# Patient Record
Sex: Female | Born: 1965 | Race: White | Hispanic: No | Marital: Single | Smoking: Never smoker
Health system: Southern US, Community
[De-identification: ages and names within clinical notes are randomized; demographics above are authoritative.]

## PROBLEM LIST (undated history)

## (undated) DIAGNOSIS — C8590 Non-Hodgkin lymphoma, unspecified, unspecified site: Secondary | ICD-10-CM

## (undated) DIAGNOSIS — Z992 Dependence on renal dialysis: Secondary | ICD-10-CM

## (undated) DIAGNOSIS — C8589 Other specified types of non-Hodgkin lymphoma, extranodal and solid organ sites: Secondary | ICD-10-CM

## (undated) DIAGNOSIS — Z941 Heart transplant status: Secondary | ICD-10-CM

## (undated) DIAGNOSIS — C8190 Hodgkin lymphoma, unspecified, unspecified site: Secondary | ICD-10-CM

## (undated) DIAGNOSIS — N186 End stage renal disease: Secondary | ICD-10-CM

## (undated) HISTORY — PX: HEART TRANSPLANT: SHX268

---

## 1997-09-02 DIAGNOSIS — Z941 Heart transplant status: Secondary | ICD-10-CM

## 1997-09-02 HISTORY — DX: Heart transplant status: Z94.1

## 2018-01-31 DIAGNOSIS — C8339 Primary central nervous system lymphoma: Secondary | ICD-10-CM

## 2018-01-31 DIAGNOSIS — C8589 Other specified types of non-Hodgkin lymphoma, extranodal and solid organ sites: Secondary | ICD-10-CM

## 2018-01-31 HISTORY — DX: Other specified types of non-hodgkin lymphoma, extranodal and solid organ sites: C85.89

## 2018-01-31 HISTORY — DX: Primary central nervous system lymphoma: C83.390

## 2018-03-02 ENCOUNTER — Inpatient Hospital Stay (HOSPITAL_COMMUNITY)
Admission: EM | Admit: 2018-03-02 | Discharge: 2018-04-02 | DRG: 100 | Disposition: E | Payer: 59 | Attending: Pulmonary Disease | Admitting: Pulmonary Disease

## 2018-03-02 ENCOUNTER — Emergency Department (HOSPITAL_COMMUNITY): Payer: 59

## 2018-03-02 ENCOUNTER — Encounter (HOSPITAL_COMMUNITY): Payer: Self-pay | Admitting: *Deleted

## 2018-03-02 ENCOUNTER — Inpatient Hospital Stay (HOSPITAL_COMMUNITY): Payer: 59

## 2018-03-02 ENCOUNTER — Other Ambulatory Visit: Payer: Self-pay

## 2018-03-02 DIAGNOSIS — Z66 Do not resuscitate: Secondary | ICD-10-CM | POA: Diagnosis present

## 2018-03-02 DIAGNOSIS — R011 Cardiac murmur, unspecified: Secondary | ICD-10-CM | POA: Diagnosis not present

## 2018-03-02 DIAGNOSIS — N2581 Secondary hyperparathyroidism of renal origin: Secondary | ICD-10-CM | POA: Diagnosis present

## 2018-03-02 DIAGNOSIS — T451X5A Adverse effect of antineoplastic and immunosuppressive drugs, initial encounter: Secondary | ICD-10-CM | POA: Diagnosis present

## 2018-03-02 DIAGNOSIS — C8339 Diffuse large B-cell lymphoma, extranodal and solid organ sites: Secondary | ICD-10-CM | POA: Diagnosis present

## 2018-03-02 DIAGNOSIS — I169 Hypertensive crisis, unspecified: Secondary | ICD-10-CM | POA: Diagnosis present

## 2018-03-02 DIAGNOSIS — J9600 Acute respiratory failure, unspecified whether with hypoxia or hypercapnia: Secondary | ICD-10-CM | POA: Diagnosis not present

## 2018-03-02 DIAGNOSIS — Z992 Dependence on renal dialysis: Secondary | ICD-10-CM | POA: Diagnosis not present

## 2018-03-02 DIAGNOSIS — C7931 Secondary malignant neoplasm of brain: Secondary | ICD-10-CM | POA: Diagnosis present

## 2018-03-02 DIAGNOSIS — N186 End stage renal disease: Secondary | ICD-10-CM

## 2018-03-02 DIAGNOSIS — R569 Unspecified convulsions: Secondary | ICD-10-CM

## 2018-03-02 DIAGNOSIS — I429 Cardiomyopathy, unspecified: Secondary | ICD-10-CM | POA: Diagnosis present

## 2018-03-02 DIAGNOSIS — I5022 Chronic systolic (congestive) heart failure: Secondary | ICD-10-CM | POA: Diagnosis present

## 2018-03-02 DIAGNOSIS — Y92009 Unspecified place in unspecified non-institutional (private) residence as the place of occurrence of the external cause: Secondary | ICD-10-CM

## 2018-03-02 DIAGNOSIS — J9601 Acute respiratory failure with hypoxia: Secondary | ICD-10-CM | POA: Diagnosis present

## 2018-03-02 DIAGNOSIS — S0990XA Unspecified injury of head, initial encounter: Secondary | ICD-10-CM

## 2018-03-02 DIAGNOSIS — E871 Hypo-osmolality and hyponatremia: Secondary | ICD-10-CM | POA: Diagnosis present

## 2018-03-02 DIAGNOSIS — R402122 Coma scale, eyes open, to pain, at arrival to emergency department: Secondary | ICD-10-CM | POA: Diagnosis present

## 2018-03-02 DIAGNOSIS — I132 Hypertensive heart and chronic kidney disease with heart failure and with stage 5 chronic kidney disease, or end stage renal disease: Secondary | ICD-10-CM | POA: Diagnosis present

## 2018-03-02 DIAGNOSIS — R402232 Coma scale, best verbal response, inappropriate words, at arrival to emergency department: Secondary | ICD-10-CM | POA: Diagnosis present

## 2018-03-02 DIAGNOSIS — T50995A Adverse effect of other drugs, medicaments and biological substances, initial encounter: Secondary | ICD-10-CM | POA: Diagnosis present

## 2018-03-02 DIAGNOSIS — G40901 Epilepsy, unspecified, not intractable, with status epilepticus: Secondary | ICD-10-CM | POA: Diagnosis present

## 2018-03-02 DIAGNOSIS — Z941 Heart transplant status: Secondary | ICD-10-CM

## 2018-03-02 DIAGNOSIS — Z8249 Family history of ischemic heart disease and other diseases of the circulatory system: Secondary | ICD-10-CM

## 2018-03-02 DIAGNOSIS — E041 Nontoxic single thyroid nodule: Secondary | ICD-10-CM | POA: Diagnosis not present

## 2018-03-02 DIAGNOSIS — M7989 Other specified soft tissue disorders: Secondary | ICD-10-CM | POA: Diagnosis present

## 2018-03-02 DIAGNOSIS — F419 Anxiety disorder, unspecified: Secondary | ICD-10-CM | POA: Diagnosis present

## 2018-03-02 DIAGNOSIS — D6489 Other specified anemias: Secondary | ICD-10-CM | POA: Diagnosis present

## 2018-03-02 DIAGNOSIS — Z79891 Long term (current) use of opiate analgesic: Secondary | ICD-10-CM

## 2018-03-02 DIAGNOSIS — R52 Pain, unspecified: Secondary | ICD-10-CM

## 2018-03-02 DIAGNOSIS — Z9221 Personal history of antineoplastic chemotherapy: Secondary | ICD-10-CM

## 2018-03-02 DIAGNOSIS — R9082 White matter disease, unspecified: Secondary | ICD-10-CM | POA: Diagnosis present

## 2018-03-02 DIAGNOSIS — I10 Essential (primary) hypertension: Secondary | ICD-10-CM

## 2018-03-02 DIAGNOSIS — Z515 Encounter for palliative care: Secondary | ICD-10-CM | POA: Diagnosis not present

## 2018-03-02 DIAGNOSIS — R402352 Coma scale, best motor response, localizes pain, at arrival to emergency department: Secondary | ICD-10-CM | POA: Diagnosis present

## 2018-03-02 DIAGNOSIS — D696 Thrombocytopenia, unspecified: Secondary | ICD-10-CM | POA: Diagnosis present

## 2018-03-02 DIAGNOSIS — C8589 Other specified types of non-Hodgkin lymphoma, extranodal and solid organ sites: Secondary | ICD-10-CM | POA: Diagnosis not present

## 2018-03-02 DIAGNOSIS — E8809 Other disorders of plasma-protein metabolism, not elsewhere classified: Secondary | ICD-10-CM | POA: Diagnosis present

## 2018-03-02 DIAGNOSIS — W19XXXA Unspecified fall, initial encounter: Secondary | ICD-10-CM | POA: Diagnosis present

## 2018-03-02 DIAGNOSIS — E079 Disorder of thyroid, unspecified: Secondary | ICD-10-CM | POA: Diagnosis present

## 2018-03-02 DIAGNOSIS — Z7952 Long term (current) use of systemic steroids: Secondary | ICD-10-CM

## 2018-03-02 DIAGNOSIS — Z79899 Other long term (current) drug therapy: Secondary | ICD-10-CM

## 2018-03-02 DIAGNOSIS — G8190 Hemiplegia, unspecified affecting unspecified side: Secondary | ICD-10-CM | POA: Diagnosis present

## 2018-03-02 DIAGNOSIS — I871 Compression of vein: Secondary | ICD-10-CM | POA: Diagnosis present

## 2018-03-02 DIAGNOSIS — C8190 Hodgkin lymphoma, unspecified, unspecified site: Secondary | ICD-10-CM | POA: Insufficient documentation

## 2018-03-02 DIAGNOSIS — D631 Anemia in chronic kidney disease: Secondary | ICD-10-CM | POA: Diagnosis present

## 2018-03-02 DIAGNOSIS — G9340 Encephalopathy, unspecified: Secondary | ICD-10-CM | POA: Diagnosis present

## 2018-03-02 DIAGNOSIS — Z96643 Presence of artificial hip joint, bilateral: Secondary | ICD-10-CM | POA: Diagnosis present

## 2018-03-02 DIAGNOSIS — K219 Gastro-esophageal reflux disease without esophagitis: Secondary | ICD-10-CM | POA: Diagnosis present

## 2018-03-02 DIAGNOSIS — R9401 Abnormal electroencephalogram [EEG]: Secondary | ICD-10-CM | POA: Diagnosis present

## 2018-03-02 DIAGNOSIS — G8384 Todd's paralysis (postepileptic): Secondary | ICD-10-CM | POA: Diagnosis present

## 2018-03-02 DIAGNOSIS — Z7189 Other specified counseling: Secondary | ICD-10-CM

## 2018-03-02 DIAGNOSIS — Z8572 Personal history of non-Hodgkin lymphomas: Secondary | ICD-10-CM

## 2018-03-02 HISTORY — DX: Hodgkin lymphoma, unspecified, unspecified site: C81.90

## 2018-03-02 HISTORY — DX: Dependence on renal dialysis: Z99.2

## 2018-03-02 HISTORY — DX: Heart transplant status: Z94.1

## 2018-03-02 HISTORY — DX: Other specified types of non-hodgkin lymphoma, extranodal and solid organ sites: C85.89

## 2018-03-02 HISTORY — DX: Non-Hodgkin lymphoma, unspecified, unspecified site: C85.90

## 2018-03-02 HISTORY — DX: Dependence on renal dialysis: N18.6

## 2018-03-02 LAB — RAPID URINE DRUG SCREEN, HOSP PERFORMED
AMPHETAMINES: NOT DETECTED
BENZODIAZEPINES: NOT DETECTED
Cocaine: NOT DETECTED
Opiates: POSITIVE — AB
TETRAHYDROCANNABINOL: NOT DETECTED

## 2018-03-02 LAB — URINALYSIS, ROUTINE W REFLEX MICROSCOPIC
Bilirubin Urine: NEGATIVE
GLUCOSE, UA: 150 mg/dL — AB
Ketones, ur: NEGATIVE mg/dL
Leukocytes, UA: NEGATIVE
Nitrite: NEGATIVE
PH: 7 (ref 5.0–8.0)
PROTEIN: 100 mg/dL — AB
Specific Gravity, Urine: 1.012 (ref 1.005–1.030)

## 2018-03-02 LAB — CBC WITH DIFFERENTIAL/PLATELET
Basophils Absolute: 0 10*3/uL (ref 0.0–0.1)
Basophils Relative: 0 %
EOS ABS: 0 10*3/uL (ref 0.0–0.7)
Eosinophils Relative: 0 %
HEMATOCRIT: 40.3 % (ref 36.0–46.0)
Hemoglobin: 12 g/dL (ref 12.0–15.0)
LYMPHS PCT: 6 %
Lymphs Abs: 0.6 10*3/uL — ABNORMAL LOW (ref 0.7–4.0)
MCH: 29.1 pg (ref 26.0–34.0)
MCHC: 29.8 g/dL — ABNORMAL LOW (ref 30.0–36.0)
MCV: 97.6 fL (ref 78.0–100.0)
Monocytes Absolute: 0.4 10*3/uL (ref 0.1–1.0)
Monocytes Relative: 4 %
NEUTROS ABS: 8.9 10*3/uL — AB (ref 1.7–7.7)
NEUTROS PCT: 90 %
Platelets: 44 10*3/uL — ABNORMAL LOW (ref 150–400)
RBC: 4.13 MIL/uL (ref 3.87–5.11)
RDW: 13.5 % (ref 11.5–15.5)
WBC: 9.9 10*3/uL (ref 4.0–10.5)

## 2018-03-02 LAB — POCT I-STAT 3, ART BLOOD GAS (G3+)
Acid-Base Excess: 3 mmol/L — ABNORMAL HIGH (ref 0.0–2.0)
BICARBONATE: 28.3 mmol/L — AB (ref 20.0–28.0)
O2 Saturation: 99 %
TCO2: 30 mmol/L (ref 22–32)
pCO2 arterial: 42.7 mmHg (ref 32.0–48.0)
pH, Arterial: 7.422 (ref 7.350–7.450)
pO2, Arterial: 109 mmHg — ABNORMAL HIGH (ref 83.0–108.0)

## 2018-03-02 LAB — I-STAT VENOUS BLOOD GAS, ED
Acid-base deficit: 3 mmol/L — ABNORMAL HIGH (ref 0.0–2.0)
BICARBONATE: 22.6 mmol/L (ref 20.0–28.0)
O2 SAT: 98 %
PCO2 VEN: 43.1 mmHg — AB (ref 44.0–60.0)
PO2 VEN: 124 mmHg — AB (ref 32.0–45.0)
TCO2: 24 mmol/L (ref 22–32)
pH, Ven: 7.328 (ref 7.250–7.430)

## 2018-03-02 LAB — I-STAT CHEM 8, ED
BUN: 98 mg/dL — AB (ref 6–20)
CALCIUM ION: 0.93 mmol/L — AB (ref 1.15–1.40)
CREATININE: 6.4 mg/dL — AB (ref 0.44–1.00)
Chloride: 95 mmol/L — ABNORMAL LOW (ref 98–111)
GLUCOSE: 136 mg/dL — AB (ref 70–99)
HCT: 38 % (ref 36.0–46.0)
HEMOGLOBIN: 12.9 g/dL (ref 12.0–15.0)
Potassium: 4.3 mmol/L (ref 3.5–5.1)
Sodium: 132 mmol/L — ABNORMAL LOW (ref 135–145)
TCO2: 23 mmol/L (ref 22–32)

## 2018-03-02 LAB — PROTIME-INR
INR: 1.12
PROTHROMBIN TIME: 14.3 s (ref 11.4–15.2)

## 2018-03-02 LAB — COMPREHENSIVE METABOLIC PANEL
ALT: 8 U/L (ref 0–44)
ANION GAP: 19 — AB (ref 5–15)
AST: 18 U/L (ref 15–41)
Albumin: 2.4 g/dL — ABNORMAL LOW (ref 3.5–5.0)
Alkaline Phosphatase: 112 U/L (ref 38–126)
BUN: 99 mg/dL — ABNORMAL HIGH (ref 6–20)
CO2: 22 mmol/L (ref 22–32)
Calcium: 7.5 mg/dL — ABNORMAL LOW (ref 8.9–10.3)
Chloride: 94 mmol/L — ABNORMAL LOW (ref 98–111)
Creatinine, Ser: 6.47 mg/dL — ABNORMAL HIGH (ref 0.44–1.00)
GFR calc non Af Amer: 7 mL/min — ABNORMAL LOW (ref >60)
GFR, EST AFRICAN AMERICAN: 8 mL/min — AB (ref >60)
Glucose, Bld: 143 mg/dL — ABNORMAL HIGH (ref 70–99)
POTASSIUM: 4.4 mmol/L (ref 3.5–5.1)
SODIUM: 135 mmol/L (ref 135–145)
Total Bilirubin: 0.8 mg/dL (ref 0.3–1.2)
Total Protein: 4.3 g/dL — ABNORMAL LOW (ref 6.5–8.1)

## 2018-03-02 LAB — TSH: TSH: 0.039 u[IU]/mL — AB (ref 0.350–4.500)

## 2018-03-02 LAB — I-STAT TROPONIN, ED: TROPONIN I, POC: 0.03 ng/mL (ref 0.00–0.08)

## 2018-03-02 LAB — BRAIN NATRIURETIC PEPTIDE: B Natriuretic Peptide: 91.6 pg/mL (ref 0.0–100.0)

## 2018-03-02 LAB — TRIGLYCERIDES: Triglycerides: 96 mg/dL (ref <150)

## 2018-03-02 LAB — CBG MONITORING, ED: Glucose-Capillary: 137 mg/dL — ABNORMAL HIGH (ref 70–99)

## 2018-03-02 LAB — T4, FREE: Free T4: 1.24 ng/dL (ref 0.82–1.77)

## 2018-03-02 LAB — MRSA PCR SCREENING: MRSA by PCR: NEGATIVE

## 2018-03-02 LAB — ETHANOL

## 2018-03-02 MED ORDER — HEPARIN 1000 UNIT/ML FOR PERITONEAL DIALYSIS
INTRAPERITONEAL | Status: DC | PRN
  Filled 2018-03-02: qty 5000

## 2018-03-02 MED ORDER — HEPARIN 1000 UNIT/ML FOR PERITONEAL DIALYSIS: 500.0000 [IU] | INTRAMUSCULAR | Status: DC | PRN

## 2018-03-02 MED ORDER — DEXAMETHASONE SODIUM PHOSPHATE 10 MG/ML IJ SOLN
4.0000 mg | Freq: Three times a day (TID) | INTRAMUSCULAR | Status: DC
  Administered 2018-03-02 – 2018-03-04 (×5): 4 mg via INTRAVENOUS
  Administered 2018-03-04: 04:00:00 via INTRAVENOUS
  Administered 2018-03-04 – 2018-03-07 (×10): 4 mg via INTRAVENOUS
  Filled 2018-03-02 (×16): qty 1

## 2018-03-02 MED ORDER — PROPOFOL 1000 MG/100ML IV EMUL
0.0000 ug/kg/min | INTRAVENOUS | Status: DC
  Administered 2018-03-03 (×2): 30 ug/kg/min via INTRAVENOUS
  Filled 2018-03-02 (×2): qty 100

## 2018-03-02 MED ORDER — VALPROATE SODIUM 500 MG/5ML IV SOLN
15.0000 mg/kg/d | Freq: Three times a day (TID) | INTRAVENOUS | Status: DC
  Administered 2018-03-02 – 2018-03-03 (×3): 286 mg via INTRAVENOUS
  Filled 2018-03-02 (×4): qty 2.86

## 2018-03-02 MED ORDER — SODIUM CHLORIDE 0.9 % IV SOLN
INTRAVENOUS | Status: DC
  Administered 2018-03-04: 18:00:00 via INTRAVENOUS

## 2018-03-02 MED ORDER — ETOMIDATE 2 MG/ML IV SOLN
INTRAVENOUS | Status: AC | PRN
  Administered 2018-03-02: 20 mg via INTRAVENOUS

## 2018-03-02 MED ORDER — LEVETIRACETAM IN NACL 1000 MG/100ML IV SOLN
1000.0000 mg | Freq: Once | INTRAVENOUS | Status: AC
  Administered 2018-03-02: 1000 mg via INTRAVENOUS
  Filled 2018-03-02: qty 100

## 2018-03-02 MED ORDER — FENTANYL CITRATE (PF) 100 MCG/2ML IJ SOLN
100.0000 ug | INTRAMUSCULAR | Status: DC | PRN
  Administered 2018-03-02: 100 ug via INTRAVENOUS
  Filled 2018-03-02: qty 2

## 2018-03-02 MED ORDER — HEPARIN SODIUM (PORCINE) 5000 UNIT/ML IJ SOLN
5000.0000 [IU] | Freq: Three times a day (TID) | INTRAMUSCULAR | Status: DC
Start: 2018-03-02 — End: 2018-03-04
  Administered 2018-03-02 – 2018-03-03 (×4): 5000 [IU] via SUBCUTANEOUS
  Filled 2018-03-02 (×4): qty 1

## 2018-03-02 MED ORDER — MYCOPHENOLATE MOFETIL 250 MG PO CAPS
250.0000 mg | ORAL_CAPSULE | Freq: Two times a day (BID) | ORAL | Status: DC
  Administered 2018-03-02 – 2018-03-03 (×2): 250 mg via ORAL
  Filled 2018-03-02 (×4): qty 1

## 2018-03-02 MED ORDER — LEVETIRACETAM IN NACL 500 MG/100ML IV SOLN
500.0000 mg | Freq: Two times a day (BID) | INTRAVENOUS | Status: DC
  Administered 2018-03-02 – 2018-03-08 (×11): 500 mg via INTRAVENOUS
  Filled 2018-03-02 (×15): qty 100

## 2018-03-02 MED ORDER — ORAL CARE MOUTH RINSE
15.0000 mL | OROMUCOSAL | Status: DC
  Administered 2018-03-02 – 2018-03-03 (×10): 15 mL via OROMUCOSAL

## 2018-03-02 MED ORDER — LORAZEPAM 2 MG/ML IJ SOLN
2.0000 mg | Freq: Once | INTRAMUSCULAR | Status: AC
  Administered 2018-03-02: 2 mg via INTRAVENOUS

## 2018-03-02 MED ORDER — FENTANYL CITRATE (PF) 100 MCG/2ML IJ SOLN
100.0000 ug | INTRAMUSCULAR | Status: DC | PRN
  Administered 2018-03-03: 100 ug via INTRAVENOUS
  Filled 2018-03-02: qty 2

## 2018-03-02 MED ORDER — CYCLOSPORINE MODIFIED (NEORAL) 100 MG/ML PO SOLN
25.0000 mg | Freq: Two times a day (BID) | ORAL | Status: DC
  Administered 2018-03-02 – 2018-03-03 (×2): 25 mg
  Filled 2018-03-02 (×4): qty 0.3

## 2018-03-02 MED ORDER — CHLORHEXIDINE GLUCONATE 0.12% ORAL RINSE (MEDLINE KIT)
15.0000 mL | Freq: Two times a day (BID) | OROMUCOSAL | Status: DC
  Administered 2018-03-02 – 2018-03-03 (×2): 15 mL via OROMUCOSAL

## 2018-03-02 MED ORDER — GENTAMICIN SULFATE 0.1 % EX CREA
1.0000 "application " | TOPICAL_CREAM | Freq: Every day | CUTANEOUS | Status: DC
  Administered 2018-03-03 (×2): 1 via TOPICAL
  Filled 2018-03-02: qty 15

## 2018-03-02 MED ORDER — DELFLEX-LC/1.5% DEXTROSE 344 MOSM/L IP SOLN
Freq: Every day | INTRAPERITONEAL | Status: DC
  Administered 2018-03-03: 5000 mL via INTRAPERITONEAL

## 2018-03-02 MED ORDER — PROPOFOL 1000 MG/100ML IV EMUL
0.0000 ug/kg/min | INTRAVENOUS | Status: DC
  Administered 2018-03-02: 5 ug/kg/min via INTRAVENOUS
  Filled 2018-03-02: qty 100

## 2018-03-02 MED ORDER — ACETAMINOPHEN 325 MG PO TABS: 650.0000 mg | ORAL_TABLET | ORAL | Status: DC | PRN

## 2018-03-02 MED ORDER — PANTOPRAZOLE SODIUM 40 MG PO PACK
40.0000 mg | PACK | Freq: Every day | ORAL | Status: DC
  Administered 2018-03-02 – 2018-03-03 (×2): 40 mg
  Filled 2018-03-02: qty 20

## 2018-03-02 MED ORDER — SODIUM CHLORIDE 0.9 % IV SOLN: 250.0000 mL | INTRAVENOUS | Status: DC | PRN

## 2018-03-02 MED ORDER — ROCURONIUM BROMIDE 50 MG/5ML IV SOLN
INTRAVENOUS | Status: AC | PRN
  Administered 2018-03-02: 60 mg via INTRAVENOUS

## 2018-03-02 MED ORDER — VALPROATE SODIUM 500 MG/5ML IV SOLN
1140.0000 mg | INTRAVENOUS | Status: AC
  Filled 2018-03-02: qty 11.4

## 2018-03-02 MED ORDER — DELFLEX-LC/2.5% DEXTROSE 394 MOSM/L IP SOLN
Freq: Every day | INTRAPERITONEAL | Status: DC
  Administered 2018-03-03: 5000 mL via INTRAPERITONEAL

## 2018-03-02 MED ORDER — HYDRALAZINE HCL 20 MG/ML IJ SOLN: 10.0000 mg | INTRAMUSCULAR | Status: DC | PRN

## 2018-03-02 MED ORDER — LORAZEPAM 2 MG/ML IJ SOLN
INTRAMUSCULAR | Status: AC
  Filled 2018-03-02: qty 1

## 2018-03-02 MED ORDER — MIDAZOLAM HCL 2 MG/2ML IJ SOLN
2.0000 mg | INTRAMUSCULAR | Status: DC | PRN
  Administered 2018-03-02 – 2018-03-03 (×2): 2 mg via INTRAVENOUS
  Filled 2018-03-02 (×2): qty 2

## 2018-03-02 NOTE — Progress Notes (Signed)
[place] Progress Note Patient Name: Cheyenne Sanchez DOB: [birth year] MRN: [id number]   Date of Service  03/25/2018  HPI/Events of Note  Family refuses [place] of Thyroid and plans comfort care in AM.   eICU Interventions  Will order: 1. D/C [place] of Thy[place].      Intervention Category Major Interventions: Other:  Lysle Dingwall 03/27/2018, 10:29 PM

## 2018-03-02 NOTE — Progress Notes (Signed)
Pt transported from ED TRAC to 4N 25 without incident.

## 2018-03-02 NOTE — Consult Note (Addendum)
NEURO HOSPITALIST CONSULT NOTE   Requestig physician: Dr. Reather Converse  Reason for Consult: Seizure  History obtained from:   Mother HPI:                                                                                                                                          Cheyenne Sanchez is an 52 y.o. female with significant past medical history.  Her primary care is over at [facility].  Speaking to the mother initially she was seen at [facility] apparently cyclosporine was given to her and injured her kidneys, she is now on peritoneal dialysis.  On June 7 she had her first seizure and was placed on Keppra 500 mg twice daily.  She has not had a seizure since then.  Today apparently she was at home, apparently fell backwards hit her head but did not lose consciousness.  EMS was called, and en route she had a 2-minute seizure that was focused on her left side.  Upon entering the emergency room she had another 2-minute seizure for which she was given 2 mg of Ativan and then started on 5 mcg/kilogram/minute propofol, intubated for airway protection.  Currently patient is intubated, on propofol, and completely plegic likely secondary to the rocuronium and etomidate still in her system.   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 Looking at CT scan it appears to be on the right side, concordant with her left-sided seizures.  She does take peritoneal dialysis. She is on 500 mg Keppra twice daily.  Per up-to-date this is the maximum that she can be on daily given peritoneal dialysis.  Currently AST is 18 and ALT is 8  Past Medical History:  Diagnosis Date  . Heart transplant status ([place])   . Hodgkin's disease ([place])   . Renal disorder     Past Surgical History:  Procedure Laterality Date  . HEART TRANSPLANT      Family History  Problem Relation Age of Onset  . Hypertension Mother   . Hypertension Father     Social History:  has no tobacco, alcohol,  and drug history on file.  Allergies not on file  MEDICATIONS:  Current Facility-Administered Medications  Medication Dose Route Frequency Provider Last Rate Last Dose  . LORazepam (ATIVAN) 2 MG/ML injection           . propofol (DIPRIVAN) 1000 MG/100ML infusion  0-50 mcg/kg/min Intravenous Continuous Elnora Morrison, MD 1.7 mL/hr at 03/13/2018 1106 5 mcg/kg/min at 03/14/2018 1106   No current outpatient medications on file.  No medications on file however I do not that she is on prednisone, Keppra   ROS:                                                                                                                                       History obtained from unobtainable from patient due to mental status   Blood pressure (!) 166/106, pulse 95, temperature (!) 94.7 F (34.8 C), temperature source Core, resp. rate (!) 24, height 5' (1.524 m), weight 57.2 kg (126 lb), SpO2 97 %.  General Examination:                                                                                                       Physical Exam  HEENT-  Normocephalic, no lesions, without obvious abnormality.  Normal external eye and conjunctiva.   Extremities- Warm, dry and intact Musculoskeletal-no joint tenderness, deformity or swelling Skin-warm and dry, no hyperpigmentation, vitiligo, or suspicious lesions  Neurological Examination Mental Status: Patient does not respond to verbal stimuli.  Intubated not breathing over the vent.  Does not respond to deep sternal rub.  Does not follow commands.  No verbalizations noted.  Currently on 5 mg/kilogram/per minute of propofol.  Cranial Nerves: II: No blink to threat bilaterally. Pupils right 2 mm, left 2 mm,and reactive bilaterally. III,IV,VI: Doll's response absent bilaterally (sedated) V,VII: Corneal reflex absent bilaterally (sedated)  VIII: patient does  not respond to verbal stimuli (sedated) IX,X: gag reflex absent, XI: trapezius strength unable to test bilaterally XII: tongue strength unable to test Motor: Extremities flaccid throughout.  No spontaneous movement noted.  No purposeful movements noted. Sensory: Does not respond to noxious stimuli in any extremity. Deep Tendon Reflexes:  Absent throughout (sedated) Plantars: equivocal bilaterally Cerebellar: Unable to perform   Lab Results: Basic Metabolic Panel: Recent Labs  Lab 03/18/2018 1000 03/05/2018 1013  NA 135 132*  K 4.4 4.3  CL 94* 95*  CO2 22  --   GLUCOSE 143* 136*  BUN 99* 98*  CREATININE 6.47* 6.40*  CALCIUM 7.5*  --     CBC: Recent Labs  Lab  03/30/2018 1000 03/27/2018 1013  WBC 9.9  --   NEUTROABS 8.9*  --   HGB 12.0 12.9  HCT 40.3 38.0  MCV 97.6  --   PLT 44*  --     Cardiac Enzymes: No results for input(s): CKTOTAL, CKMB, CKMBINDEX, TROPONINI in the last 168 hours.  Lipid Panel: Recent Labs  Lab 03/22/2018 1000  TRIG 96    Imaging: Ct Head Wo Contrast  Result Date: 03/07/2018 CLINICAL DATA:  Fall EXAM: CT HEAD WITHOUT CONTRAST CT CERVICAL SPINE WITHOUT CONTRAST TECHNIQUE: Multidetector CT imaging of the head and cervical spine was performed following the standard protocol without intravenous contrast. Multiplanar CT image reconstructions of the cervical spine were also generated. COMPARISON:  None. FINDINGS: CT HEAD FINDINGS Brain: Low-density areas throughout the deep white matter. No hemorrhage or hydrocephalus. No acute infarction. Vascular: No hyperdense vessel or unexpected calcification. Skull: No acute calvarial abnormality. Sinuses/Orbits: Visualized paranasal sinuses and mastoids clear. Orbital soft tissues unremarkable. Other: Soft tissue swelling in the right posterior parietal region. CT CERVICAL SPINE FINDINGS Alignment: Normal Skull base and vertebrae: No fracture Soft tissues and spinal canal: No prevertebral fluid or swelling. No visible  canal hematoma. Disc levels:  Maintained Upper chest: No acute findings Other: Large mass in the right thyroid lobe measures up to 5 cm. Smaller nodule in the left thyroid lobe measures 1.7 cm. Tracheostomy and NG tube are noted in place. IMPRESSION: Patchy low-density white matter disease bilaterally. This could reflect chronic microvascular ischemic changes or other cause of demyelination such as vasculitis, MS. No acute intracranial abnormality. No acute bony abnormality in the cervical spine. Bilateral thyroid nodules and masses, right greater than left. This likely reflects multinodular goiter. This could be further characterized with elective thyroid ultrasound. Electronically Signed   By: Rolm Baptise M.D.   On: 03/22/2018 10:54   Ct Cervical Spine Wo Contrast  Result Date: 03/21/2018 CLINICAL DATA:  Fall EXAM: CT HEAD WITHOUT CONTRAST CT CERVICAL SPINE WITHOUT CONTRAST TECHNIQUE: Multidetector CT imaging of the head and cervical spine was performed following the standard protocol without intravenous contrast. Multiplanar CT image reconstructions of the cervical spine were also generated. COMPARISON:  None. FINDINGS: CT HEAD FINDINGS Brain: Low-density areas throughout the deep white matter. No hemorrhage or hydrocephalus. No acute infarction. Vascular: No hyperdense vessel or unexpected calcification. Skull: No acute calvarial abnormality. Sinuses/Orbits: Visualized paranasal sinuses and mastoids clear. Orbital soft tissues unremarkable. Other: Soft tissue swelling in the right posterior parietal region. CT CERVICAL SPINE FINDINGS Alignment: Normal Skull base and vertebrae: No fracture Soft tissues and spinal canal: No prevertebral fluid or swelling. No visible canal hematoma. Disc levels:  Maintained Upper chest: No acute findings Other: Large mass in the right thyroid lobe measures up to 5 cm. Smaller nodule in the left thyroid lobe measures 1.7 cm. Tracheostomy and NG tube are noted in place.  IMPRESSION: Patchy low-density white matter disease bilaterally. This could reflect chronic microvascular ischemic changes or other cause of demyelination such as vasculitis, MS. No acute intracranial abnormality. No acute bony abnormality in the cervical spine. Bilateral thyroid nodules and masses, right greater than left. This likely reflects multinodular goiter. This could be further characterized with elective thyroid ultrasound. Electronically Signed   By: Rolm Baptise M.D.   On: 03/28/2018 10:54   Dg Chest Portable 1 View  Result Date: 04/01/2018 CLINICAL DATA:  Recent fall and altered mental status EXAM: PORTABLE CHEST 1 VIEW COMPARISON:  None. FINDINGS: Cardiac shadow is within normal limits.  Endotracheal tube is noted 1.4 cm above the carina. This could be withdrawn 1-2 cm as clinically indicated. Nasogastric catheter extends into the stomach. Patchy changes are noted in the bases greater on the left than the right consistent with early infiltrate/atelectasis. No bony abnormality is noted. IMPRESSION: Bibasilar changes. Tubes and lines as described above. The endotracheal tube could be withdrawn 1-2 cm as clinically indicated. Electronically Signed   By: Inez Catalina M.D.   On: 03/22/2018 10:26    History and examination documented by Etta Quill PA-C, [facility] [phone] 03/07/2018, 12:18 PM   Assessment:  52 year old female with known CNS lymphoma currently on Keppra with breakthrough seizure x2. 1. 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 No electrographic seizure seen.   Amended Recommendations: --Continue propofol sedation for 24 hours, then wean and observe to ensure no seizure recurrence --Continue LTM EEG --Continue Keppra and Depakote --Seizure precautions --Frequent neuro checks  45 minutes spent in the emergent evaluation and management of this critically ill patient  Electronically signed: Dr. Kerney Elbe

## 2018-03-02 NOTE — Progress Notes (Signed)
Patient transported to CT from [facility] without complications.

## 2018-03-02 NOTE — Progress Notes (Signed)
Pt transported to 4N25.  LTM resumed.

## 2018-03-02 NOTE — Progress Notes (Signed)
ET tube pulled back from 23 to 22 cm, measured @ lip.

## 2018-03-02 NOTE — H&P (Addendum)
PULMONARY / CRITICAL CARE MEDICINE   Name: Cheyenne Sanchez MRN: [id number] DOB: Jan 24, 1966    ADMISSION DATE:  03/21/2018 CONSULTATION DATE:  7/1  REFERRING MD:  Dr. Reather Converse EDP  CHIEF COMPLAINT:  Seizure  HISTORY OF PRESENT ILLNESS: 52 year old female with past medical history as below, which is significant for non-hodgkin's lymphoma, heart transport 20 years ago (after chemo caused heart failure), and renal failure requiring peritoneal dialysis. 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  Most recently did dialysis on 6/30 in the evening hours.  This was without complication.  At baseline she uses a walker for ambulation.  In the morning hours of 7/1 she fell over backwards striking her head but did not lose consciousness.  EMS was called and she was in route to [facility], however, she began seizing in the ambulance was diverted to [facility].  She was postictal upon arrival to the emergency department, but then suffered an additional seizure episode which was abated with 2 mg of Ativan.  She was intubated at that time for airway protection.  She had a CT scan of the head, which demonstrated soft tissue swelling in the right posterior parietal region, but no acute intracranial abnormalities. PCCM asked to evaluate for admission.   PAST MEDICAL HISTORY :  She  has a past medical history of Heart transplant status ([place]), Hodgkin's disease ([place]), and Renal disorder.  PAST SURGICAL HISTORY: She  has a past surgical history that includes Heart transplant.  Allergies not on file  No current facility-administered medications on file prior to encounter.    No current outpatient medications on file prior to encounter.    FAMILY HISTORY:  Her has no family status information on file.    SOCIAL HISTORY: She    REVIEW OF SYSTEMS:   unable  SUBJECTIVE:  unable  VITAL SIGNS: BP (!) 166/106   Pulse 95    Temp (!) 94.7 F (34.8 C) (Core) Comment: Barehugger placed  Resp (!) 24   Ht 5' (1.524 m)   Wt 57.2 kg (126 lb)   SpO2 97%   BMI 24.61 kg/m   HEMODYNAMICS:    VENTILATOR SETTINGS: Vent Mode: PRVC FiO2 (%):  [40 %-60 %] 60 % Set Rate:  [18 bmp] 18 bmp Vt Set:  [360 mL] 360 mL PEEP:  [5 cmH20] 5 cmH20 Plateau Pressure:  [19 cmH20] 19 cmH20  INTAKE / OUTPUT: No intake/output data recorded.  PHYSICAL EXAMINATION: General:  Overweight middle aged female in NAD on vent Neuro:  Sedated HEENT:  Pupils 2 mm and sluggish.  Cardiovascular:  RRR, no MRG. 2+ BLE edema pitting.  Lungs:  Clear bilateral breath sounds Abdomen:  Soft, non-tender, non-distended Musculoskeletal:  No acute deformity or ROM limitation Skin:  Grossly intact  LABS:  BMET Recent Labs  Lab 03/12/2018 1000 03/23/2018 1013  NA 135 132*  K 4.4 4.3  CL 94* 95*  CO2 22  --   BUN 99* 98*  CREATININE 6.47* 6.40*  GLUCOSE 143* 136*    Electrolytes Recent Labs  Lab 04/01/2018 1000  CALCIUM 7.5*    CBC Recent Labs  Lab 03/06/2018 1000 03/21/2018 1013  WBC 9.9  --   HGB 12.0 12.9  HCT 40.3 38.0  PLT 44*  --     Coag's Recent Labs  Lab 03/16/2018 1000  INR 1.12    Sepsis Markers No results for input(s): LATICACIDVEN, PROCALCITON, O2SATVEN  in the last 168 hours.  ABG No results for input(s): PHART, PCO2ART, PO2ART in the last 168 hours.  Liver Enzymes Recent Labs  Lab 04/01/2018 1000  AST 18  ALT 8  ALKPHOS 112  BILITOT 0.8  ALBUMIN 2.4*    Cardiac Enzymes No results for input(s): TROPONINI, PROBNP in the last 168 hours.  Glucose Recent Labs  Lab 03/25/2018 0954  GLUCAP 137*    Imaging Ct Head Wo Contrast  Result Date: 03/10/2018 CLINICAL DATA:  Fall EXAM: CT HEAD WITHOUT CONTRAST CT CERVICAL SPINE WITHOUT CONTRAST TECHNIQUE: Multidetector CT imaging of the head and cervical spine was performed following the standard protocol without intravenous contrast. Multiplanar CT image  reconstructions of the cervical spine were also generated. COMPARISON:  None. FINDINGS: CT HEAD FINDINGS Brain: Low-density areas throughout the deep white matter. No hemorrhage or hydrocephalus. No acute infarction. Vascular: No hyperdense vessel or unexpected calcification. Skull: No acute calvarial abnormality. Sinuses/Orbits: Visualized paranasal sinuses and mastoids clear. Orbital soft tissues unremarkable. Other: Soft tissue swelling in the right posterior parietal region. CT CERVICAL SPINE FINDINGS Alignment: Normal Skull base and vertebrae: No fracture Soft tissues and spinal canal: No prevertebral fluid or swelling. No visible canal hematoma. Disc levels:  Maintained Upper chest: No acute findings Other: Large mass in the right thyroid lobe measures up to 5 cm. Smaller nodule in the left thyroid lobe measures 1.7 cm. Tracheostomy and NG tube are noted in place. IMPRESSION: Patchy low-density white matter disease bilaterally. This could reflect chronic microvascular ischemic changes or other cause of demyelination such as vasculitis, MS. No acute intracranial abnormality. No acute bony abnormality in the cervical spine. Bilateral thyroid nodules and masses, right greater than left. This likely reflects multinodular goiter. This could be further characterized with elective thyroid ultrasound. Electronically Signed   By: Rolm Baptise M.D.   On: 03/11/2018 10:54   Ct Cervical Spine Wo Contrast  Result Date: 03/07/2018 CLINICAL DATA:  Fall EXAM: CT HEAD WITHOUT CONTRAST CT CERVICAL SPINE WITHOUT CONTRAST TECHNIQUE: Multidetector CT imaging of the head and cervical spine was performed following the standard protocol without intravenous contrast. Multiplanar CT image reconstructions of the cervical spine were also generated. COMPARISON:  None. FINDINGS: CT HEAD FINDINGS Brain: Low-density areas throughout the deep white matter. No hemorrhage or hydrocephalus. No acute infarction. Vascular: No hyperdense vessel  or unexpected calcification. Skull: No acute calvarial abnormality. Sinuses/Orbits: Visualized paranasal sinuses and mastoids clear. Orbital soft tissues unremarkable. Other: Soft tissue swelling in the right posterior parietal region. CT CERVICAL SPINE FINDINGS Alignment: Normal Skull base and vertebrae: No fracture Soft tissues and spinal canal: No prevertebral fluid or swelling. No visible canal hematoma. Disc levels:  Maintained Upper chest: No acute findings Other: Large mass in the right thyroid lobe measures up to 5 cm. Smaller nodule in the left thyroid lobe measures 1.7 cm. Tracheostomy and NG tube are noted in place. IMPRESSION: Patchy low-density white matter disease bilaterally. This could reflect chronic microvascular ischemic changes or other cause of demyelination such as vasculitis, MS. No acute intracranial abnormality. No acute bony abnormality in the cervical spine. Bilateral thyroid nodules and masses, right greater than left. This likely reflects multinodular goiter. This could be further characterized with elective thyroid ultrasound. Electronically Signed   By: Rolm Baptise M.D.   On: 04/01/2018 10:54   Dg Chest Portable 1 View  Result Date: 03/19/2018 CLINICAL DATA:  Recent fall and altered mental status EXAM: PORTABLE CHEST 1 VIEW COMPARISON:  None. FINDINGS: Cardiac shadow  is within normal limits. Endotracheal tube is noted 1.4 cm above the carina. This could be withdrawn 1-2 cm as clinically indicated. Nasogastric catheter extends into the stomach. Patchy changes are noted in the bases greater on the left than the right consistent with early infiltrate/atelectasis. No bony abnormality is noted. IMPRESSION: Bibasilar changes. Tubes and lines as described above. The endotracheal tube could be withdrawn 1-2 cm as clinically indicated. Electronically Signed   By: Inez Catalina M.D.   On: 03/25/2018 10:26    STUDIES:  CT head/Cspine 7/1: Patchy low-density white matter disease  bilaterally. This could reflect chronic microvascular ischemic changes or other cause of demyelination such as vasculitis, MS. No acute intracranial abnormality. No acute bony abnormality in the cervical spine. Bilateral thyroid nodules and masses, right greater than left. This likely reflects multinodular goiter.   CULTURES:   ANTIBIOTICS:   SIGNIFICANT EVENTS: 7/1 fall, seizure. Intubated for airway and admitted to ICU.   LINES/TUBES: ETT 7/1 >  DISCUSSION: 52 year old female with complicate past medical history. Non-hoodgkins in the past, chemo left heart weak requiring transplant 1999. Then developed renal failure and she is on PD. 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 Now presenting with seizure requiring intubation for airway. Admit to ICU for continuous EEG and IV AED. Neurology consulting.   ASSESSMENT / PLAN:  PULMONARY A: Inability to protect airway secondary to post ictal vs status epilepticus  P:   Full vent support ABG Follow CXR VAP bundle  CARDIOVASCULAR A:  Hypertensive crisis S/p Heart Transplant "20 years ago"  P:  Telemetry monitoring BP improved with sedation PRN hydralazine Continue home cellcept and cyclosporine   RENAL A:   ESRD on PD (last session 6/30) Hypocalcemia  P:   Will follow labs and consult nephrology when indicated.   GASTROINTESTINAL A:   GERD  P:   NPO Protonix via tube  HEMATOLOGIC A:   CNS lymphoma, primary.   P:  Continue home decadron If she is able to have some recovery we should get her transferred to [facility] where all her physicians are.   INFECTIOUS A:   Immunocompromised   P:   Continuing  ENDOCRINE A:   Bilateral thyroid mass    P:   TSH, T4 Will order ultrasound of thyroid. If looks like mets may help with overall prognostication.   NEUROLOGIC A:   Seizure CNS lymphoma  P:   RASS goal: -1 to -2 Propofol infusion for sedation.  Keppra loaded in  ED Neurology consulted, starting continuous EEG.  AEDs per neuro.   Code: DNR should she arrest. OK with short term vent. Likely OK with pressors would need to check. 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   FAMILY  - Updates: Family updated at length. Sister is a Marine scientist at [employer]. Mother and father also here.   - Inter-disciplinary family meet or Palliative Care meeting due by:  7/7   Georgann Housekeeper, AGACNP-BC [place] [facility] Pager [phone] or [phone]  03/20/2018 12:26 PM

## 2018-03-02 NOTE — Consult Note (Signed)
Referring Provider: No ref. provider found Primary Care Physician:  Raina Mina., MD Primary Nephrologist:  Dr.  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 CT CAP with large heterogeneous right thyroid mass measuring 3.7 x 5.6 cm and known hypodense kidney lesions. She underwent brain biopsy Dr Tivis Ringer 02/11/2018   She has a complex past medical history bilateral hip replacements 01/01/16 secondary avascular necrosis, heart transplant  03/02/98. Non hodgkins lymphoma 08/1989 and SVC syndrome and thyroid disease    She is end stage renal disease and currently treated with peritoneal dialysis at [place] .   Outpatient Dialysis prescription: Unit: [facility] Schedule: HS Access: TC Time: 9 hours 4 exchanges 1.5% and 2.5% 1591m fill volume  Mid day fill in the evening of 12049m(Patient has an evening manual fill of 12004mhich dwells for 2 to 3 hours before starting cycler)  She was admitted with status epilepticus  She is in ICU loaded with keppra, ativan and propofol sedation.     [facility]   Past Medical History:  Diagnosis Date  . CNS lymphoma (HCCBessie6/2019  . End-stage renal disease on peritoneal dialysis (HCC[facility] . 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 [place]'s [place] ([facility] . [facility]   Past Surgical History:  Procedure  Laterality Date  . HEART TRANSPLANT      Prior to Admission medications   Medication Sig Start Date End Date Taking? Authorizing Provider  calcitRIOL (ROCALTROL) 0.5 MCG capsule Take 0.5 mcg by mouth daily.   Yes [provider]  carvedilol (COREG) 25 MG tablet Take 25 mg by mouth 2 (two) times daily with a meal.   Yes [provider]  cycloSPORINE modified (NEORAL) 25 MG capsule Take 25 mg by mouth 2 (two) times daily.   Yes [provider]  dexamethasone (DECADRON) 4 MG tablet Take 4 mg by mouth 3 (three) times daily.   Yes [provider]  gentamicin cream (GARAMYCIN) 0.1 % Apply 3-4 application topically daily.   Yes [provider]  lanthanum (FOSRENOL) 1000 MG chewable tablet Chew 1,000 mg by mouth See admin instructions. Take 1 tablet three times a day with meals and snacks 2 times daily   Yes [provider]  levETIRAcetam (KEPPRA) 500 MG tablet Take 500 mg by mouth daily.   Yes [provider]  mycophenolate (CELLCEPT) 250 MG capsule Take 250 mg by mouth 2 (two) times daily.   Yes [provider]  oxycodone (OXY-IR) 5 MG capsule Take 5-10 mg by mouth every 4 (four) hours as needed for pain.   Yes [provider]  pantoprazole (PROTONIX) 40 MG tablet Take 40 mg by mouth daily.   Yes [provider]  pravastatin (PRAVACHOL) 20  MG tablet Take 20 mg by mouth daily.   Yes [provider]    Current Facility-Administered Medications  Medication Dose Route Frequency Provider Last Rate Last Dose  . 0.9 %  sodium chloride infusion  250 mL Intravenous PRN Corey Harold, NP      . 0.9 %  sodium chloride infusion   Intravenous Continuous Corey Harold, NP      . acetaminophen (TYLENOL) tablet 650 mg  650 mg Oral Q4H PRN Corey Harold, NP      . chlorhexidine gluconate (MEDLINE KIT) (PERIDEX) 0.12 % solution 15 mL  15 mL Mouth Rinse BID Corey Harold, NP      . cycloSPORINE (NEORAL) 100  MG/ML microemulsion solution 25 mg  25 mg Per Tube BID Corey Harold, NP      . dexamethasone (DECADRON) injection 4 mg  4 mg Intravenous TID Corey Harold, NP      . dialysis solution 1.5% low-MG/low-CA dianeal solution   Intraperitoneal 5 X Daily Edrick Oh, MD      . dialysis solution 2.5% low-MG/low-CA dianeal solution   Intraperitoneal 5 X Daily Edrick Oh, MD      . fentaNYL (SUBLIMAZE) injection 100 mcg  100 mcg Intravenous Q15 min PRN Corey Harold, NP      . fentaNYL (SUBLIMAZE) injection 100 mcg  100 mcg Intravenous Q2H PRN Corey Harold, NP      . gentamicin cream (GARAMYCIN) 0.1 % 1 application  1 application Topical Daily Edrick Oh, MD      . heparin 1000 unit/ml injection 500 Units  500 Units Intraperitoneal PRN Edrick Oh, MD      . heparin injection 5,000 Units  5,000 Units Subcutaneous Q8H Corey Harold, NP   5,000 Units at 03/25/2018 1456  . hydrALAZINE (APRESOLINE) injection 10-20 mg  10-20 mg Intravenous Q4H PRN Corey Harold, NP      . levETIRAcetam (KEPPRA) IVPB 500 mg/100 mL premix  500 mg Intravenous Q12H Kerney Elbe, MD      . LORazepam (ATIVAN) 2 MG/ML injection           . MEDLINE mouth rinse  15 mL Mouth Rinse 10 times per day Corey Harold, NP   15 mL at 03/19/2018 1800  . midazolam (VERSED) injection 2 mg  2 mg Intravenous Q5 Min x 3 PRN Corey Harold, NP   2 mg at 03/24/2018 1807  . mycophenolate (CELLCEPT) capsule 250 mg  250 mg Oral BID Corey Harold, NP      . pantoprazole sodium (PROTONIX) 40 mg/20 mL oral suspension 40 mg  40 mg Per Tube Daily Corey Harold, NP   40 mg at 03/18/2018 1500  . propofol (DIPRIVAN) 1000 MG/100ML infusion  0-50 mcg/kg/min Intravenous Continuous Corey Harold, NP      . valproate (DEPACON) 1,140 mg in dextrose 5 % 50 mL IVPB  1,140 mg Intravenous STAT Marliss Coots, PA-C      . valproate (DEPACON) 286 mg in dextrose 5 % 50 mL IVPB  15 mg/kg/day Intravenous Q8H Marliss Coots, PA-C   Stopped at 03/14/2018 1600     Allergies as of 03/27/2018  . (No Known Allergies)    Family History  Problem Relation Age of Onset  . Hypertension Mother   . Hypertension Father     Social History   Socioeconomic History  . Marital status: Single    Spouse name: Not on file  .  Number of children: Not on file  . Years of education: Not on file  . Highest education level: Not on file  Occupational History  . Not on file  Social Needs  . Financial resource strain: Not on file  . Food insecurity:    Worry: Not on file    Inability: Not on file  . Transportation needs:    Medical: Not on file    Non-medical: Not on file  Tobacco Use  . Smoking status: Unknown If Ever Smoked  . Smokeless tobacco: Never Used  Substance and Sexual Activity  . Alcohol use: Not on file  . Drug use: Not on file  . Sexual activity: Not on file  Lifestyle  . Physical activity:    Days per week: Not on file    Minutes per session: Not on file  . Stress: Not on file  Relationships  . Social connections:    Talks on phone: Not on file    Gets together: Not on file    Attends religious service: Not on file    Active member of club or organization: Not on file    Attends meetings of clubs or organizations: Not on file    Relationship status: Not on file  . Intimate partner violence:    Fear of current or ex partner: Not on file    Emotionally abused: Not on file    Physically abused: Not on file    Forced sexual activity: Not on file  Other Topics Concern  . Not on file  Social History Narrative  . Not on file    Review of Systems: Unable to provide   Physical Exam: Vital signs in last 24 hours: Temp:  [94.7 F (34.8 C)-97.3 F (36.3 C)] 97.3 F (36.3 C) (07/01 1700) Pulse Rate:  [86-105] 91 (07/01 1700) Resp:  [0-24] 18 (07/01 1700) BP: (116-210)/(82-140) 116/82 (07/01 1700) SpO2:  [97 %-100 %] 98 % (07/01 1700) FiO2 (%):  [40 %-60 %] 40 % (07/01 1540) Weight:  [126 lb (57.2 kg)-130 lb 8.2 oz (59.2  kg)] 130 lb 8.2 oz (59.2 kg) (07/01 1500) Last BM Date: (pta) General:   Ill appearing lady intubated and sedated on propofol  Head:  Normocephalic and atraumatic. Eyes:  Sclera clear, no icterus.   Conjunctiva pink. Ears:  Normal auditory acuity. Nose:  No deformity, discharge,  or lesions. Mouth:  No deformity or lesions, dentition normal. ETT Neck:  Supple; no masses or thyromegaly. JVP not elevated Lungs:   Some scattered rhonchi noted  Heart:  Regular rate and rhythm; no murmurs, clicks, rubs,  or gallops. Abdomen:  Soft, nontender and nondistended. No masses, hepatosplenomegaly or hernias noted. Normal bowel sounds, without guarding, and without rebound.   PD dialysis catheter site is clean and dry Msk:  Symmetrical without gross deformities. Normal posture. Pulses:  No carotid, renal, femoral bruits. DP and PT symmetrical and equal Extremities:  Without clubbing   3 + edema Neurologic:  Sedated  Skin:  Intact without significant lesions or rashes.   Intake/Output from previous day: No intake/output data recorded. Intake/Output this shift: Total I/O In: 69.9 [I.V.:17; IV Piggyback:52.9] Out: 35 [Urine:35]  Lab Results: Recent Labs    03/12/2018 1000 03/09/2018 1013  WBC 9.9  --   HGB 12.0 12.9  HCT 40.3 38.0  PLT 44*  --    BMET Recent Labs    03/22/2018 1000 03/30/2018 1013  NA 135 132*  K 4.4 4.3  CL 94* 95*  CO2 22  --   GLUCOSE 143* 136*  BUN 99* 98*  CREATININE 6.47* 6.40*  CALCIUM 7.5*  --    LFT Recent Labs    03/04/2018 1000  PROT 4.3*  ALBUMIN 2.4*  AST 18  ALT 8  ALKPHOS 112  BILITOT 0.8   PT/INR Recent Labs    03/13/2018 1000  LABPROT 14.3  INR 1.12   Hepatitis Panel No results for input(s): HEPBSAG, HCVAB, HEPAIGM, HEPBIGM in the last 72 hours.  Studies/Results: Ct Head Wo Contrast  Result Date: 03/21/2018 CLINICAL DATA:  Fall EXAM: CT HEAD WITHOUT CONTRAST CT CERVICAL SPINE WITHOUT CONTRAST TECHNIQUE: Multidetector CT imaging of the head  and cervical spine was performed following the standard protocol without intravenous contrast. Multiplanar CT image reconstructions of the cervical spine were also generated. COMPARISON:  None. FINDINGS: CT HEAD FINDINGS Brain: Low-density areas throughout the deep white matter. No hemorrhage or hydrocephalus. No acute infarction. Vascular: No hyperdense vessel or unexpected calcification. Skull: No acute calvarial abnormality. Sinuses/Orbits: Visualized paranasal sinuses and mastoids clear. Orbital soft tissues unremarkable. Other: Soft tissue swelling in the right posterior parietal region. CT CERVICAL SPINE FINDINGS Alignment: Normal Skull base and vertebrae: No fracture Soft tissues and spinal canal: No prevertebral fluid or swelling. No visible canal hematoma. Disc levels:  Maintained Upper chest: No acute findings Other: Large mass in the right thyroid lobe measures up to 5 cm. Smaller nodule in the left thyroid lobe measures 1.7 cm. Tracheostomy and NG tube are noted in place. IMPRESSION: Patchy low-density white matter disease bilaterally. This could reflect chronic microvascular ischemic changes or other cause of demyelination such as vasculitis, MS. No acute intracranial abnormality. No acute bony abnormality in the cervical spine. Bilateral thyroid nodules and masses, right greater than left. This likely reflects multinodular goiter. This could be further characterized with elective thyroid ultrasound. Electronically Signed   By: Rolm Baptise M.D.   On: 03/22/2018 10:54   Ct Cervical Spine Wo Contrast  Result Date: 03/31/2018 CLINICAL DATA:  Fall EXAM: CT HEAD WITHOUT CONTRAST CT CERVICAL SPINE WITHOUT CONTRAST TECHNIQUE: Multidetector CT imaging of the head and cervical spine was performed following the standard protocol without intravenous contrast. Multiplanar CT image reconstructions of the cervical spine were also generated. COMPARISON:  None. FINDINGS: CT HEAD FINDINGS Brain: Low-density areas  throughout the deep white matter. No hemorrhage or hydrocephalus. No acute infarction. Vascular: No hyperdense vessel or unexpected calcification. Skull: No acute calvarial abnormality. Sinuses/Orbits: Visualized paranasal sinuses and mastoids clear. Orbital soft tissues unremarkable. Other: Soft tissue swelling in the right posterior parietal region. CT CERVICAL SPINE FINDINGS Alignment: Normal Skull base and vertebrae: No fracture Soft tissues and spinal canal: No prevertebral fluid or swelling. No visible canal hematoma. Disc levels:  Maintained Upper chest: No acute findings Other: Large mass in the right thyroid lobe measures up to 5 cm. Smaller nodule in the left thyroid lobe measures 1.7 cm. Tracheostomy and NG tube are noted in place. IMPRESSION: Patchy low-density white matter disease bilaterally. This could reflect chronic microvascular ischemic changes or other cause of demyelination such as vasculitis, MS. No acute intracranial abnormality. No acute bony abnormality in the cervical spine. Bilateral thyroid nodules and masses, right greater than left. This likely reflects multinodular goiter. This could be further characterized with elective thyroid ultrasound. Electronically Signed   By: Rolm Baptise M.D.   On: 03/06/2018 10:54   Dg Chest Portable 1 View  Result Date: 03/26/2018 CLINICAL DATA:  Recent fall and altered mental  status EXAM: PORTABLE CHEST 1 VIEW COMPARISON:  None. FINDINGS: Cardiac shadow is within normal limits. Endotracheal tube is noted 1.4 cm above the carina. This could be withdrawn 1-2 cm as clinically indicated. Nasogastric catheter extends into the stomach. Patchy changes are noted in the bases greater on the left than the right consistent with early infiltrate/atelectasis. No bony abnormality is noted. IMPRESSION: Bibasilar changes. Tubes and lines as described above. The endotracheal tube could be withdrawn 1-2 cm as clinically indicated. Electronically Signed   By: Inez Catalina M.D.   On: 03/26/2018 10:26    Assessment/Plan:  1.   End stage renal disease   -- Peritoneal dialysis  [facility]    We shall continue peritoneal dialysis tonight with no pause. She apparently does have a mid day pause  I think it would be fine to wait on this and provide an extra bag tonight for clearance. I shall alternate bags of 1.5- 2.5 % I think this should manage to avoid any hemodynamic shifts that could be likely to occur.  2. Anemia   This does not pose a problem at this time 3. Volume   She is clearly edematous with lower extremity edema however I am going to be cautious with fluid removal at this point due to her fairly low blood pressures 4. CNS lymphoma recurrence  - she has had a tapering of immunosuppressant medications and is scheduled to see oncology at [facility] 5. Heart Transplant   Stable at this time on Immunosuppression 6. Status Epilepticus   Discussed with pharmacy to discuss anti siezure medication with peritoneal dialysis    LOS: 0 Mignonne Afonso W @TODAY @6 :51 PM

## 2018-03-02 NOTE — ED Triage Notes (Signed)
Patient presents to ed via [facility] states she was in the bathroom and fell, hitting her denies loc patient was being transported to [facility] when patient started seizing and became apneic upon arrival patient was being bagged, resp at bedside with EDP , patient was placed on NRB , talking a little however confused, 1000am patient started seizing left side lasted 2 mins.

## 2018-03-02 NOTE — Progress Notes (Signed)
LTM EEG started. Staff educated regarding pt event button.

## 2018-03-02 NOTE — ED Provider Notes (Signed)
[facility] EMERGENCY DEPARTMENT Provider Note   CSN: [id number] Arrival date & time: 03/19/2018  2094     History   Chief Complaint Chief Complaint  Patient presents with  . Fall  . Seizures    HPI Cheyenne Sanchez is a 52 y.o. female.  Patient presents via EMS with apnea and witnessed seizure activity since a fall earlier today.  Patient normally uses a walker at baseline unable to get details of medical history as she is not in our system and no family at bedside.  Per EMS report patient had a fall and hit the back of her head this morning.  Patient initially conversant however on route started having seizure and stopped breathing requiring bagging.  Seizure lasted probably 2 minutes and patient was postictal afterwards.  No known seizure history or seizure medications.  Patient does have a history of Hodgkin's disease and heart transplant years ago per report.  Patient has peritoneal dialysis history.     Past Medical History:  Diagnosis Date  . Heart transplant status ([place])   . Hodgkin's disease ([place])   . Renal disorder     There are no active problems to display for this patient.   Past Surgical History:  Procedure Laterality Date  . HEART TRANSPLANT       OB History   None      Home Medications    Prior to Admission medications   Not on File    Family History No family history on file.  Social History Social History   Tobacco Use  . Smoking status: Unknown If Ever Smoked  Substance Use Topics  . Alcohol use: Not on file  . Drug use: Not on file     Allergies   Patient has no allergy information on record.   Review of Systems Review of Systems  Unable to perform ROS: Acuity of condition     Physical Exam Updated Vital Signs BP (!) 166/106   Pulse 95   Temp (!) 94.7 F (34.8 C) (Core) Comment: Barehugger placed  Resp (!) 24   Ht 5' (1.524 m)   Wt 57.2 kg (126 lb)   SpO2 97%   BMI 24.61 kg/m   Physical Exam    Constitutional: She appears well-developed.  HENT:  Head: Normocephalic.  Eyes: Right eye exhibits no discharge. Left eye exhibits no discharge.  Neck: Neck supple. No tracheal deviation present.  Cardiovascular: Tachycardia present.  Pulmonary/Chest: Effort normal.  Abdominal: Soft. She exhibits no distension. There is no tenderness. There is no guarding.  Musculoskeletal: She exhibits edema (LE bilateral).  Neurological: GCS eye subscore is 2. GCS verbal subscore is 3. GCS motor subscore is 5.  Patient postictal on arrival, encephalopathic.  Patient would not follow any significant commands.  Pupils 2 mm equal bilateral.  Patient would move her right arm however difficulty moving her left arm unsure if chronic or not.  Patient does have chronic edema to the left arm.  Patient would not lift either lower extremities.  Difficulty comprehending patient's minimal speech.  Patient had approximate 2 to 3-minute episode of generalized tonic-clonic seizure.  Skin: Skin is warm. No rash noted.  Psychiatric:  Altered mental status  Nursing note and vitals reviewed.    ED Treatments / Results  Labs (all labs ordered are listed, but only abnormal results are displayed) Labs Reviewed  COMPREHENSIVE METABOLIC PANEL - Abnormal; Notable for the following components:      Result Value   Chloride  94 (*)    Glucose, Bld 143 (*)    BUN 99 (*)    Creatinine, Ser 6.47 (*)    Calcium 7.5 (*)    Total Protein 4.3 (*)    Albumin 2.4 (*)    GFR calc non Af Amer 7 (*)    GFR calc Af Amer 8 (*)    Anion gap 19 (*)    All other components within normal limits  CBC WITH DIFFERENTIAL/PLATELET - Abnormal; Notable for the following components:   MCHC 29.8 (*)    Platelets 44 (*)    Neutro Abs 8.9 (*)    Lymphs Abs 0.6 (*)    All other components within normal limits  URINALYSIS, ROUTINE W REFLEX MICROSCOPIC - Abnormal; Notable for the following components:   Glucose, UA 150 (*)    Hgb urine dipstick  MODERATE (*)    Protein, ur 100 (*)    RBC / HPF >50 (*)    Bacteria, UA FEW (*)    All other components within normal limits  RAPID URINE DRUG SCREEN, HOSP PERFORMED - Abnormal; Notable for the following components:   Opiates POSITIVE (*)    Barbiturates   (*)    Value: Result not available. Reagent lot number recalled by manufacturer.   All other components within normal limits  I-STAT VENOUS BLOOD GAS, ED - Abnormal; Notable for the following components:   pCO2, Ven 43.1 (*)    pO2, Ven 124.0 (*)    Acid-base deficit 3.0 (*)    All other components within normal limits  I-STAT CHEM 8, ED - Abnormal; Notable for the following components:   Sodium 132 (*)    Chloride 95 (*)    BUN 98 (*)    Creatinine, Ser 6.40 (*)    Glucose, Bld 136 (*)    Calcium, Ion 0.93 (*)    All other components within normal limits  CBG MONITORING, ED - Abnormal; Notable for the following components:   Glucose-Capillary 137 (*)    All other components within normal limits  ETHANOL  PROTIME-INR  BRAIN NATRIURETIC PEPTIDE  TRIGLYCERIDES  I-STAT TROPONIN, ED    EKG None  Radiology Ct Head Wo Contrast  Result Date: 03/25/2018 CLINICAL DATA:  Fall EXAM: CT HEAD WITHOUT CONTRAST CT CERVICAL SPINE WITHOUT CONTRAST TECHNIQUE: Multidetector CT imaging of the head and cervical spine was performed following the standard protocol without intravenous contrast. Multiplanar CT image reconstructions of the cervical spine were also generated. COMPARISON:  None. FINDINGS: CT HEAD FINDINGS Brain: Low-density areas throughout the deep white matter. No hemorrhage or hydrocephalus. No acute infarction. Vascular: No hyperdense vessel or unexpected calcification. Skull: No acute calvarial abnormality. Sinuses/Orbits: Visualized paranasal sinuses and mastoids clear. Orbital soft tissues unremarkable. Other: Soft tissue swelling in the right posterior parietal region. CT CERVICAL SPINE FINDINGS Alignment: Normal Skull base and  vertebrae: No fracture Soft tissues and spinal canal: No prevertebral fluid or swelling. No visible canal hematoma. Disc levels:  Maintained Upper chest: No acute findings Other: Large mass in the right thyroid lobe measures up to 5 cm. Smaller nodule in the left thyroid lobe measures 1.7 cm. Tracheostomy and NG tube are noted in place. IMPRESSION: Patchy low-density white matter disease bilaterally. This could reflect chronic microvascular ischemic changes or other cause of demyelination such as vasculitis, MS. No acute intracranial abnormality. No acute bony abnormality in the cervical spine. Bilateral thyroid nodules and masses, right greater than left. This likely reflects multinodular goiter. This could be  further characterized with elective thyroid ultrasound. Electronically Signed   By: Rolm Baptise M.D.   On: 03/27/2018 10:54   Ct Cervical Spine Wo Contrast  Result Date: 03/09/2018 CLINICAL DATA:  Fall EXAM: CT HEAD WITHOUT CONTRAST CT CERVICAL SPINE WITHOUT CONTRAST TECHNIQUE: Multidetector CT imaging of the head and cervical spine was performed following the standard protocol without intravenous contrast. Multiplanar CT image reconstructions of the cervical spine were also generated. COMPARISON:  None. FINDINGS: CT HEAD FINDINGS Brain: Low-density areas throughout the deep white matter. No hemorrhage or hydrocephalus. No acute infarction. Vascular: No hyperdense vessel or unexpected calcification. Skull: No acute calvarial abnormality. Sinuses/Orbits: Visualized paranasal sinuses and mastoids clear. Orbital soft tissues unremarkable. Other: Soft tissue swelling in the right posterior parietal region. CT CERVICAL SPINE FINDINGS Alignment: Normal Skull base and vertebrae: No fracture Soft tissues and spinal canal: No prevertebral fluid or swelling. No visible canal hematoma. Disc levels:  Maintained Upper chest: No acute findings Other: Large mass in the right thyroid lobe measures up to 5 cm. Smaller  nodule in the left thyroid lobe measures 1.7 cm. Tracheostomy and NG tube are noted in place. IMPRESSION: Patchy low-density white matter disease bilaterally. This could reflect chronic microvascular ischemic changes or other cause of demyelination such as vasculitis, MS. No acute intracranial abnormality. No acute bony abnormality in the cervical spine. Bilateral thyroid nodules and masses, right greater than left. This likely reflects multinodular goiter. This could be further characterized with elective thyroid ultrasound. Electronically Signed   By: Rolm Baptise M.D.   On: 03/24/2018 10:54   Dg Chest Portable 1 View  Result Date: 03/18/2018 CLINICAL DATA:  Recent fall and altered mental status EXAM: PORTABLE CHEST 1 VIEW COMPARISON:  None. FINDINGS: Cardiac shadow is within normal limits. Endotracheal tube is noted 1.4 cm above the carina. This could be withdrawn 1-2 cm as clinically indicated. Nasogastric catheter extends into the stomach. Patchy changes are noted in the bases greater on the left than the right consistent with early infiltrate/atelectasis. No bony abnormality is noted. IMPRESSION: Bibasilar changes. Tubes and lines as described above. The endotracheal tube could be withdrawn 1-2 cm as clinically indicated. Electronically Signed   By: Inez Catalina M.D.   On: 03/13/2018 10:26    Procedures Procedure Name: Intubation Date/Time: 03/06/2018 12:06 PM Performed by: Elnora Morrison, MD Pre-anesthesia Checklist: Patient identified, Patient being monitored, Emergency Drugs available, Timeout performed and Suction available Oxygen Delivery Method: Non-rebreather mask Preoxygenation: Pre-oxygenation with 100% oxygen Induction Type: Rapid sequence Ventilation: Mask ventilation without difficulty Laryngoscope Size: 3 Grade View: Grade II Number of attempts: 1 Placement Confirmation: ETT inserted through vocal cords under direct vision,  CO2 detector and Breath sounds checked- equal and  bilateral Secured at: 24 cm Tube secured with: ETT holder    .Critical Care Performed by: Elnora Morrison, MD Authorized by: Elnora Morrison, MD   Critical care provider statement:    Critical care time (minutes):  75   Critical care start time:  03/20/2018 10:50 PM   Critical care end time:  03/30/2018 12:05 PM   Critical care time was exclusive of:  Separately billable procedures and treating other patients and teaching time   Critical care was necessary to treat or prevent imminent or life-threatening deterioration of the following conditions:  CNS failure or compromise   Critical care was time spent personally by me on the following activities:  Evaluation of patient's response to treatment, examination of patient, ordering and review of laboratory studies, ordering and  review of radiographic studies, pulse oximetry and re-evaluation of patient's condition   I assumed direction of critical care for this patient from another provider in my specialty: no     (including critical care time)  Medications Ordered in ED Medications  LORazepam (ATIVAN) 2 MG/ML injection (has no administration in time range)  propofol (DIPRIVAN) 1000 MG/100ML infusion (5 mcg/kg/min  57.2 kg Intravenous New Bag/Given 03/16/2018 1106)  LORazepam (ATIVAN) injection 2 mg (2 mg Intravenous Given 03/30/2018 1000)  levETIRAcetam (KEPPRA) IVPB 1000 mg/100 mL premix (0 mg Intravenous Stopped 03/19/2018 1111)  etomidate (AMIDATE) injection (20 mg Intravenous Given 03/28/2018 1011)  rocuronium (ZEMURON) injection (60 mg Intravenous Given 03/11/2018 1011)     Initial Impression / Assessment and Plan / ED Course  I have reviewed the triage vital signs and the nursing notes.  Pertinent labs & imaging results that were available during my care of the patient were reviewed by me and considered in my medical decision making (see chart for details).    Patient presents with clinical status epilepticus via EMS.  Patient was improving on  arrival however shortly after had a second generalized tonic-clonic seizure after 3 or 4 minutes was still not protecting her airway.  Ativan was given followed by Keppra bolus.  Decision to intubate as concern for possible head bleed with head trauma and seizure activity versus stroke versus other.  Paged neurology for consult.  Page critical care for admission.  Blood work returned consistent with her kidney failure history of elevated creatinine.  CT head no acute bleed, x-ray for chest reviewed no acute findings. Blood work reviewed consistent with thrombocytopenia, chronic renal failure, hypo-chloride.  Discussed with neurology for continuous EEG monitoring and further work-up. Any x-rays performed were independently reviewed by myself.   Differential diagnosis were considered with the presenting HPI.  Medications  LORazepam (ATIVAN) 2 MG/ML injection (has no administration in time range)  propofol (DIPRIVAN) 1000 MG/100ML infusion (5 mcg/kg/min  57.2 kg Intravenous New Bag/Given 03/09/2018 1106)  LORazepam (ATIVAN) injection 2 mg (2 mg Intravenous Given 03/04/2018 1000)  levETIRAcetam (KEPPRA) IVPB 1000 mg/100 mL premix (0 mg Intravenous Stopped 03/06/2018 1111)  etomidate (AMIDATE) injection (20 mg Intravenous Given 03/21/2018 1011)  rocuronium (ZEMURON) injection (60 mg Intravenous Given 03/25/2018 1011)    Vitals:   03/23/2018 1018 04/01/2018 1030 03/13/2018 1100 03/12/2018 1125  BP:  (!) 149/102 (!) 166/106   Pulse:  95    Resp:  19 (!) 24   Temp:    (!) 94.7 F (34.8 C)  TempSrc:    Core  SpO2:  97%    Weight:      Height: 5' (1.524 m)       Final diagnoses:  Acute head injury, initial encounter  Status epilepticus ([place])  Acute encephalopathy  Essential hypertension    Admission/ observation were discussed with the admitting physician, patient and/or family and they are comfortable with the plan.    Final Clinical Impressions(s) / ED Diagnoses   Final diagnoses:  Acute head injury,  initial encounter  Status epilepticus [facility])  Acute encephalopathy  Essential hypertension    ED Discharge Orders    None       Elnora Morrison, MD 03/26/2018 1209

## 2018-03-03 ENCOUNTER — Inpatient Hospital Stay (HOSPITAL_COMMUNITY): Payer: 59

## 2018-03-03 DIAGNOSIS — C8589 Other specified types of non-Hodgkin lymphoma, extranodal and solid organ sites: Secondary | ICD-10-CM

## 2018-03-03 DIAGNOSIS — Z7189 Other specified counseling: Secondary | ICD-10-CM

## 2018-03-03 DIAGNOSIS — E041 Nontoxic single thyroid nodule: Secondary | ICD-10-CM

## 2018-03-03 DIAGNOSIS — Z515 Encounter for palliative care: Secondary | ICD-10-CM

## 2018-03-03 LAB — POCT I-STAT 3, ART BLOOD GAS (G3+)
Bicarbonate: 24.6 mmol/L (ref 20.0–28.0)
O2 Saturation: 99 %
PH ART: 7.424 (ref 7.350–7.450)
TCO2: 26 mmol/L (ref 22–32)
pCO2 arterial: 37.4 mmHg (ref 32.0–48.0)
pO2, Arterial: 135 mmHg — ABNORMAL HIGH (ref 83.0–108.0)

## 2018-03-03 LAB — CBC
HCT: 31 % — ABNORMAL LOW (ref 36.0–46.0)
HEMOGLOBIN: 9.2 g/dL — AB (ref 12.0–15.0)
MCH: 28.8 pg (ref 26.0–34.0)
MCHC: 29.7 g/dL — ABNORMAL LOW (ref 30.0–36.0)
MCV: 97.2 fL (ref 78.0–100.0)
Platelets: 32 10*3/uL — ABNORMAL LOW (ref 150–400)
RBC: 3.19 MIL/uL — AB (ref 3.87–5.11)
RDW: 13.7 % (ref 11.5–15.5)
WBC: 4.8 10*3/uL (ref 4.0–10.5)

## 2018-03-03 LAB — BASIC METABOLIC PANEL
Anion gap: 14 (ref 5–15)
BUN: 99 mg/dL — ABNORMAL HIGH (ref 6–20)
CHLORIDE: 96 mmol/L — AB (ref 98–111)
CO2: 24 mmol/L (ref 22–32)
Calcium: 7.1 mg/dL — ABNORMAL LOW (ref 8.9–10.3)
Creatinine, Ser: 6.13 mg/dL — ABNORMAL HIGH (ref 0.44–1.00)
GFR calc Af Amer: 8 mL/min — ABNORMAL LOW (ref >60)
GFR calc non Af Amer: 7 mL/min — ABNORMAL LOW (ref >60)
GLUCOSE: 124 mg/dL — AB (ref 70–99)
POTASSIUM: 4.6 mmol/L (ref 3.5–5.1)
Sodium: 134 mmol/L — ABNORMAL LOW (ref 135–145)

## 2018-03-03 LAB — HIV ANTIBODY (ROUTINE TESTING W REFLEX): HIV Screen 4th Generation wRfx: NONREACTIVE

## 2018-03-03 LAB — VALPROIC ACID LEVEL: VALPROIC ACID LVL: 13 ug/mL — AB (ref 50.0–100.0)

## 2018-03-03 LAB — MAGNESIUM: MAGNESIUM: 1.4 mg/dL — AB (ref 1.7–2.4)

## 2018-03-03 LAB — PHOSPHORUS: Phosphorus: 7.2 mg/dL — ABNORMAL HIGH (ref 2.5–4.6)

## 2018-03-03 MED ORDER — VALPROATE SODIUM 500 MG/5ML IV SOLN
10.0000 mg/kg | Freq: Once | INTRAVENOUS | Status: AC
  Administered 2018-03-03: 605 mg via INTRAVENOUS
  Filled 2018-03-03: qty 6.05

## 2018-03-03 MED ORDER — VALPROATE SODIUM 500 MG/5ML IV SOLN
500.0000 mg | Freq: Three times a day (TID) | INTRAVENOUS | Status: DC
  Administered 2018-03-03 – 2018-03-07 (×14): 500 mg via INTRAVENOUS
  Filled 2018-03-03 (×17): qty 5

## 2018-03-03 MED ORDER — DEXMEDETOMIDINE HCL IN NACL 400 MCG/100ML IV SOLN
0.4000 ug/kg/h | INTRAVENOUS | Status: DC
  Administered 2018-03-03 – 2018-03-04 (×2): 0.4 ug/kg/h via INTRAVENOUS
  Filled 2018-03-03 (×2): qty 100

## 2018-03-03 MED ORDER — WHITE PETROLATUM EX OINT
TOPICAL_OINTMENT | CUTANEOUS | Status: AC
  Filled 2018-03-03: qty 28.35

## 2018-03-03 NOTE — Progress Notes (Signed)
Subjective: Jerking was noted by RN this morning. She received a small dose of Versed. Propofol gtt is running at a rate of 30.  Objective: Current vital signs: BP 108/78   Pulse 89   Temp 97.7 F (36.5 C)   Resp 18   Ht 5' (1.524 m)   Wt 60.5 kg (133 lb 6.1 oz)   SpO2 98%   BMI 26.05 kg/m  Vital signs in last 24 hours: Temp:  [94.7 F (34.8 C)-98.2 F (36.8 C)] 97.7 F (36.5 C) (07/02 0700) Pulse Rate:  [86-105] 89 (07/02 0700) Resp:  [0-24] 18 (07/02 0700) BP: (103-210)/(70-140) 108/78 (07/02 0700) SpO2:  [97 %-100 %] 98 % (07/02 0700) FiO2 (%):  [40 %-60 %] 40 % (07/02 0407) Weight:  [57.2 kg (126 lb)-60.5 kg (133 lb 6.1 oz)] 60.5 kg (133 lb 6.1 oz) (07/02 0500)  Intake/Output from previous day: 07/01 0701 - 07/02 0700 In: 407.5 [I.V.:119.8; IV Piggyback:287.7] Out: 35 [Urine:35] Intake/Output this shift: No intake/output data recorded. Nutritional status:  Diet Order           Diet NPO time specified  Diet effective now          Neurologic Exam: (performed while sedated with propofol) Mental Status: Patient does not respond to verbal stimuli. Eyes closed with no arousal to noxious stimuli. Intubated not breathing over the vent.  Does not respond to light sternal rub.  Does not follow commands.  No attempts to communicate.. Currently on 30 mcg/kilogram/per minute of propofol.  Cranial Nerves: II: No blink to threat bilaterally. Pupils right 2 mm, left 2 mm,and reactive bilaterally. III,IV,VI: Doll's response absent bilaterally (sedated) VIII: patient does not respond to verbal stimuli (sedated) XI: trapezius strength unable to test bilaterally XII: tongue strength unable to test Motor: Extremities flaccid throughout.  No spontaneous movement noted.  No purposeful movements noted. Sensory: Does not respond to noxious stimuli in any extremity. Deep Tendon Reflexes:  Absent throughout (sedated)  Lab Results: Results for orders placed or performed during the  hospital encounter of 03/09/2018 (from the past 48 hour(s))  CBG monitoring, ED     Status: Abnormal   Collection Time: 04/01/2018  9:54 AM  Result Value Ref Range   Glucose-Capillary 137 (H) 70 - 99 mg/dL  Comprehensive metabolic panel     Status: Abnormal   Collection Time: 03/16/2018 10:00 AM  Result Value Ref Range   Sodium 135 135 - 145 mmol/L   Potassium 4.4 3.5 - 5.1 mmol/L   Chloride 94 (L) 98 - 111 mmol/L    Comment: Please note change in reference range.   CO2 22 22 - 32 mmol/L   Glucose, Bld 143 (H) 70 - 99 mg/dL    Comment: Please note change in reference range.   BUN 99 (H) 6 - 20 mg/dL    Comment: Please note change in reference range.   Creatinine, Ser 6.47 (H) 0.44 - 1.00 mg/dL   Calcium 7.5 (L) 8.9 - 10.3 mg/dL   Total Protein 4.3 (L) 6.5 - 8.1 g/dL   Albumin 2.4 (L) 3.5 - 5.0 g/dL   AST 18 15 - 41 U/L   ALT 8 0 - 44 U/L    Comment: Please note change in reference range.   Alkaline Phosphatase 112 38 - 126 U/L   Total Bilirubin 0.8 0.3 - 1.2 mg/dL   GFR calc non Af Amer 7 (L) >60 mL/min   GFR calc Af Amer 8 (L) >60 mL/min  Comment: (NOTE) The eGFR has been calculated using the CKD EPI equation. This calculation has not been validated in all clinical situations. eGFR's persistently <60 mL/min signify possible Chronic Kidney Disease.    Anion gap 19 (H) 5 - 15    Comment: Performed at [facility], [place] [address], [place], [place] [address]  CBC with Differential     Status: Abnormal   Collection Time: 03/11/2018 10:00 AM  Result Value Ref Range   WBC 9.9 4.0 - 10.5 K/uL   RBC 4.13 3.87 - 5.11 MIL/uL   Hemoglobin 12.0 12.0 - 15.0 g/dL   HCT 40.3 36.0 - 46.0 %   MCV 97.6 78.0 - 100.0 fL   MCH 29.1 26.0 - 34.0 pg   MCHC 29.8 (L) 30.0 - 36.0 g/dL   RDW 13.5 11.5 - 15.5 %   Platelets 44 (L) 150 - 400 K/uL    Comment: REPEATED TO VERIFY PLATELET COUNT CONFIRMED BY SMEAR    Neutrophils Relative % 90 %   Lymphocytes Relative 6 %   Monocytes Relative 4 %    Eosinophils Relative 0 %   Basophils Relative 0 %   Neutro Abs 8.9 (H) 1.7 - 7.7 K/uL   Lymphs Abs 0.6 (L) 0.7 - 4.0 K/uL   Monocytes Absolute 0.4 0.1 - 1.0 K/uL   Eosinophils Absolute 0.0 0.0 - 0.7 K/uL   Basophils Absolute 0.0 0.0 - 0.1 K/uL    Comment: Performed at [facility] [address], [place], [place] [address]  Protime-INR     Status: None   Collection Time: 03/03/2018 10:00 AM  Result Value Ref Range   Prothrombin Time 14.3 11.4 - 15.2 seconds   INR 1.12     Comment: Performed at [facility] [address], [place], [place] [address]  Brain natriuretic peptide     Status: None   Collection Time: 04/01/2018 10:00 AM  Result Value Ref Range   B Natriuretic Peptide 91.6 0.0 - 100.0 pg/mL    Comment: Performed at [facility] [address] [place], [place] [address]  Triglycerides     Status: None   Collection Time: 03/09/2018 10:00 AM  Result Value Ref Range   Triglycerides 96 <150 mg/dL    Comment: Performed at [facility] [address], [place], [place] [address]  I-stat troponin, ED     Status: None   Collection Time: 03/13/2018 10:11 AM  Result Value Ref Range   Troponin i, poc 0.03 0.00 - 0.08 ng/mL   Comment 3            Comment: Due to the release kinetics of cTnI, a negative result within the first hours of the onset of symptoms does not rule out myocardial infarction with certainty. If myocardial infarction is still suspected, repeat the test at appropriate intervals.   I-Stat venous blood gas, ED     Status: Abnormal   Collection Time: 03/09/2018 10:12 AM  Result Value Ref Range   pH, Ven 7.328 7.250 - 7.430   pCO2, Ven 43.1 (L) 44.0 - 60.0 mmHg   pO2, Ven 124.0 (H) 32.0 - 45.0 mmHg   Bicarbonate 22.6 20.0 - 28.0 mmol/L   TCO2 24 22 - 32 mmol/L   O2 Saturation 98.0 %   Acid-base deficit 3.0 (H) 0.0 - 2.0 mmol/L   Patient temperature HIDE    Sample type VENOUS   I-stat chem 8, ed     Status:  Abnormal   Collection Time:  03/21/2018 10:13 AM  Result Value Ref Range   Sodium 132 (L) 135 - 145 mmol/L   Potassium 4.3 3.5 - 5.1 mmol/L   Chloride 95 (L) 98 - 111 mmol/L   BUN 98 (H) 6 - 20 mg/dL   Creatinine, Ser 6.40 (H) 0.44 - 1.00 mg/dL   Glucose, Bld 136 (H) 70 - 99 mg/dL   Calcium, Ion 0.93 (L) 1.15 - 1.40 mmol/L   TCO2 23 22 - 32 mmol/L   Hemoglobin 12.9 12.0 - 15.0 g/dL   HCT 38.0 36.0 - 46.0 %  Ethanol     Status: None   Collection Time: 04/01/2018 10:42 AM  Result Value Ref Range   Alcohol, Ethyl (B) <10 <10 mg/dL    Comment: (NOTE) Lowest detectable limit for serum alcohol is 10 mg/dL. For medical purposes only. Performed at [facility], [place] [address] [place], [place] [address]   Urinalysis, Routine w reflex microscopic     Status: Abnormal   Collection Time: 03/31/2018 11:14 AM  Result Value Ref Range   Color, Urine YELLOW YELLOW   APPearance CLEAR CLEAR   Specific Gravity, Urine 1.012 1.005 - 1.030   pH 7.0 5.0 - 8.0   Glucose, UA 150 (A) NEGATIVE mg/dL   Hgb urine dipstick MODERATE (A) NEGATIVE   Bilirubin Urine NEGATIVE NEGATIVE   Ketones, ur NEGATIVE NEGATIVE mg/dL   Protein, ur 100 (A) NEGATIVE mg/dL   Nitrite NEGATIVE NEGATIVE   Leukocytes, UA NEGATIVE NEGATIVE   RBC / HPF >50 (H) 0 - 5 RBC/hpf   WBC, UA 6-10 0 - 5 WBC/hpf   Bacteria, UA FEW (A) NONE SEEN   Squamous Epithelial / LPF 0-5 0 - 5    Comment: Performed at [facility], [address], [place], [place] [address]  Urine rapid drug screen (hosp performed)     Status: Abnormal   Collection Time: 03/19/2018 11:14 AM  Result Value Ref Range   Opiates POSITIVE (A) NONE DETECTED   Cocaine NONE DETECTED NONE DETECTED   Benzodiazepines NONE DETECTED NONE DETECTED   Amphetamines NONE DETECTED NONE DETECTED   Tetrahydrocannabinol NONE DETECTED NONE DETECTED   Barbiturates (A) NONE DETECTED    Result not available. Reagent lot number recalled by manufacturer.    Comment: Performed at [facility], [place]  [address] [place], [place] [address]  I-STAT 3, arterial blood gas (G3+)     Status: Abnormal   Collection Time: 03/14/2018  1:41 PM  Result Value Ref Range   pH, Arterial 7.422 7.350 - 7.450   pCO2 arterial 42.7 32.0 - 48.0 mmHg   pO2, Arterial 109.0 (H) 83.0 - 108.0 mmHg   Bicarbonate 28.3 (H) 20.0 - 28.0 mmol/L   TCO2 30 22 - 32 mmol/L   O2 Saturation 99.0 %   Acid-Base Excess 3.0 (H) 0.0 - 2.0 mmol/L   Patient temperature 95.5 F    Collection site RADIAL, ALLEN'S TEST ACCEPTABLE    Drawn by RT    Sample type ARTERIAL   HIV antibody (Routine Testing)     Status: None   Collection Time: 03/13/2018  1:53 PM  Result Value Ref Range   HIV Screen 4th Generation wRfx Non Reactive Non Reactive    Comment: (NOTE) Performed At: [facility] [address] [place], [place] [id number] Rush Farmer MD [id number] Performed at [facility], [place] [address], [place], [place] [address]   TSH  Status: Abnormal   Collection Time: 03/15/2018  1:53 PM  Result Value Ref Range   TSH 0.039 (L) 0.350 - 4.500 uIU/mL    Comment: Performed by a 3rd Generation assay with a functional sensitivity of <=0.01 uIU/mL. Performed at [facility], [place] [address] [place], [place] [address]   T4, free     Status: None   Collection Time: 03/11/2018  1:53 PM  Result Value Ref Range   Free T4 1.24 0.82 - 1.77 ng/dL    Comment: (NOTE) Biotin ingestion may interfere with free T4 tests. If the results are inconsistent with the TSH level, previous test results, or the clinical presentation, then consider biotin interference. If needed, order repeat testing after stopping biotin. Performed at [facility], [place] [address] [place], [place] [address]   MRSA PCR Screening     Status: None   Collection Time: 03/05/2018  5:17 PM  Result Value Ref Range   MRSA by PCR NEGATIVE NEGATIVE    Comment:        The GeneXpert MRSA Assay (FDA approved for NASAL specimens only), is one component of  a comprehensive MRSA colonization surveillance program. It is not intended to diagnose MRSA infection nor to guide or monitor treatment for MRSA infections. Performed at [facility], [place] [address], [place], [place] [address]   CBC     Status: Abnormal   Collection Time: 03/03/18  3:17 AM  Result Value Ref Range   WBC 4.8 4.0 - 10.5 K/uL   RBC 3.19 (L) 3.87 - 5.11 MIL/uL   Hemoglobin 9.2 (L) 12.0 - 15.0 g/dL    Comment: REPEATED TO VERIFY DELTA CHECK NOTED    HCT 31.0 (L) 36.0 - 46.0 %   MCV 97.2 78.0 - 100.0 fL   MCH 28.8 26.0 - 34.0 pg   MCHC 29.7 (L) 30.0 - 36.0 g/dL   RDW 13.7 11.5 - 15.5 %   Platelets 32 (L) 150 - 400 K/uL    Comment: REPEATED TO VERIFY CONSISTENT WITH PREVIOUS RESULT Performed at [facility], [place] [address], [place], [place] [address]   Basic metabolic panel     Status: Abnormal   Collection Time: 03/03/18  3:17 AM  Result Value Ref Range   Sodium 134 (L) 135 - 145 mmol/L   Potassium 4.6 3.5 - 5.1 mmol/L   Chloride 96 (L) 98 - 111 mmol/L    Comment: Please note change in reference range.   CO2 24 22 - 32 mmol/L   Glucose, Bld 124 (H) 70 - 99 mg/dL    Comment: Please note change in reference range.   BUN 99 (H) 6 - 20 mg/dL    Comment: Please note change in reference range.   Creatinine, Ser 6.13 (H) 0.44 - 1.00 mg/dL   Calcium 7.1 (L) 8.9 - 10.3 mg/dL   GFR calc non Af Amer 7 (L) >60 mL/min   GFR calc Af Amer 8 (L) >60 mL/min    Comment: (NOTE) The eGFR has been calculated using the CKD EPI equation. This calculation has not been validated in all clinical situations. eGFR's persistently <60 mL/min signify possible Chronic Kidney Disease.    Anion gap 14 5 - 15    Comment: Performed at [facility] [address], [place], [place] [address]  Magnesium     Status: Abnormal   Collection Time: 03/03/18  3:17 AM  Result Value Ref Range   Magnesium 1.4 (L)  1.7 - 2.4 mg/dL    Comment: Performed at [facility],  [place] [address], [place], [place] [address]  Phosphorus     Status: Abnormal   Collection Time: 03/03/18  3:17 AM  Result Value Ref Range   Phosphorus 7.2 (H) 2.5 - 4.6 mg/dL    Comment: Performed at [facility] [address] [place], [place] [address]  Valproic acid level     Status: Abnormal   Collection Time: 03/03/18  3:17 AM  Result Value Ref Range   Valproic Acid Lvl 13 (L) 50.0 - 100.0 ug/mL    Comment: Performed at [facility] [address], [place], [place] [address]  I-STAT 3, arterial blood gas (G3+)     Status: Abnormal   Collection Time: 03/03/18  4:41 AM  Result Value Ref Range   pH, Arterial 7.424 7.350 - 7.450   pCO2 arterial 37.4 32.0 - 48.0 mmHg   pO2, Arterial 135.0 (H) 83.0 - 108.0 mmHg   Bicarbonate 24.6 20.0 - 28.0 mmol/L   TCO2 26 22 - 32 mmol/L   O2 Saturation 99.0 %   Patient temperature 36.6 C    Collection site RADIAL, ALLEN'S TEST ACCEPTABLE    Drawn by Operator    Sample type ARTERIAL     Recent Results (from the past 240 hour(s))  MRSA PCR Screening     Status: None   Collection Time: 03/06/2018  5:17 PM  Result Value Ref Range Status   MRSA by PCR NEGATIVE NEGATIVE Final    Comment:        The GeneXpert MRSA Assay (FDA approved for NASAL specimens only), is one component of a comprehensive MRSA colonization surveillance program. It is not intended to diagnose MRSA infection nor to guide or monitor treatment for MRSA infections. Performed at [facility], [place] [address], [place], [place] [address]     Lipid Panel Recent Labs    03/07/2018 1000  TRIG 96    Studies/Results: Ct Head Wo Contrast  Result Date: 03/20/2018 CLINICAL DATA:  Fall EXAM: CT HEAD WITHOUT CONTRAST CT CERVICAL SPINE WITHOUT CONTRAST TECHNIQUE: Multidetector CT imaging of the head and cervical spine was performed following the standard protocol without intravenous contrast. Multiplanar CT image reconstructions of the cervical spine were also  generated. COMPARISON:  None. FINDINGS: CT HEAD FINDINGS Brain: Low-density areas throughout the deep white matter. No hemorrhage or hydrocephalus. No acute infarction. Vascular: No hyperdense vessel or unexpected calcification. Skull: No acute calvarial abnormality. Sinuses/Orbits: Visualized paranasal sinuses and mastoids clear. Orbital soft tissues unremarkable. Other: Soft tissue swelling in the right posterior parietal region. CT CERVICAL SPINE FINDINGS Alignment: Normal Skull base and vertebrae: No fracture Soft tissues and spinal canal: No prevertebral fluid or swelling. No visible canal hematoma. Disc levels:  Maintained Upper chest: No acute findings Other: Large mass in the right thyroid lobe measures up to 5 cm. Smaller nodule in the left thyroid lobe measures 1.7 cm. Tracheostomy and NG tube are noted in place. IMPRESSION: Patchy low-density white matter disease bilaterally. This could reflect chronic microvascular ischemic changes or other cause of demyelination such as vasculitis, MS. No acute intracranial abnormality. No acute bony abnormality in the cervical spine. Bilateral thyroid nodules and masses, right greater than left. This likely reflects multinodular goiter. This could be further characterized with elective thyroid ultrasound. Electronically Signed   By: Rolm Baptise M.D.   On: 03/20/2018 10:54   Ct Cervical Spine Wo Contrast  Result Date: 03/22/2018 CLINICAL DATA:  Fall EXAM: CT HEAD WITHOUT CONTRAST CT CERVICAL SPINE WITHOUT CONTRAST TECHNIQUE: Multidetector CT imaging of the head and cervical spine was performed following the standard protocol without intravenous contrast. Multiplanar CT image reconstructions of the cervical spine were also generated. COMPARISON:  None. FINDINGS: CT HEAD FINDINGS Brain: Low-density areas throughout the deep white matter. No hemorrhage or hydrocephalus. No acute infarction. Vascular: No hyperdense vessel or unexpected calcification. Skull: No acute  calvarial abnormality. Sinuses/Orbits: Visualized paranasal sinuses and mastoids clear. Orbital soft tissues unremarkable. Other: Soft tissue swelling in the right posterior parietal region. CT CERVICAL SPINE FINDINGS Alignment: Normal Skull base and vertebrae: No fracture Soft tissues and spinal canal: No prevertebral fluid or swelling. No visible canal hematoma. Disc levels:  Maintained Upper chest: No acute findings Other: Large mass in the right thyroid lobe measures up to 5 cm. Smaller nodule in the left thyroid lobe measures 1.7 cm. Tracheostomy and NG tube are noted in place. IMPRESSION: Patchy low-density white matter disease bilaterally. This could reflect chronic microvascular ischemic changes or other cause of demyelination such as vasculitis, MS. No acute intracranial abnormality. No acute bony abnormality in the cervical spine. Bilateral thyroid nodules and masses, right greater than left. This likely reflects multinodular goiter. This could be further characterized with elective thyroid ultrasound. Electronically Signed   By: Rolm Baptise M.D.   On: 03/31/2018 10:54   Dg Chest Port 1 View  Result Date: 03/03/2018 CLINICAL DATA:  Ventilator, seizures EXAM: PORTABLE CHEST 1 VIEW COMPARISON:  03/04/2018 FINDINGS: Prior median sternotomy and CABG. Heart is normal size. Small left pleural effusion with left lower lobe atelectasis or infiltrate, similar to prior study. Improving aeration in the right base with decreasing atelectasis. No pneumothorax. IMPRESSION: Continued left lower lobe atelectasis or consolidation with small left effusion. Improving aeration in the right base. Electronically Signed   By: Rolm Baptise M.D.   On: 03/03/2018 07:28   Dg Chest Portable 1 View  Result Date: 03/24/2018 CLINICAL DATA:  Recent fall and altered mental status EXAM: PORTABLE CHEST 1 VIEW COMPARISON:  None. FINDINGS: Cardiac shadow is within normal limits. Endotracheal tube is noted 1.4 cm above the carina. This  could be withdrawn 1-2 cm as clinically indicated. Nasogastric catheter extends into the stomach. Patchy changes are noted in the bases greater on the left than the right consistent with early infiltrate/atelectasis. No bony abnormality is noted. IMPRESSION: Bibasilar changes. Tubes and lines as described above. The endotracheal tube could be withdrawn 1-2 cm as clinically indicated. Electronically Signed   By: Inez Catalina M.D.   On: 03/20/2018 10:26    Medications:  Scheduled: . chlorhexidine gluconate (MEDLINE KIT)  15 mL Mouth Rinse BID  . cycloSPORINE  25 mg Per Tube BID  . dexamethasone  4 mg Intravenous TID  . gentamicin cream  1 application Topical Daily  . heparin  5,000 Units Subcutaneous Q8H  . mouth rinse  15 mL Mouth Rinse 10 times per day  . mycophenolate  250 mg Oral BID  . pantoprazole sodium  40 mg Per Tube Daily   Continuous: . sodium chloride    . sodium chloride    . dialysis solution 1.5% low-MG/low-CA    . dialysis solution 2.5% low-MG/low-CA    . levETIRAcetam Stopped (03/17/2018 2125)  . propofol (DIPRIVAN) infusion 30 mcg/kg/min (03/03/18 0600)  . valproate sodium    . valproate sodium 52.9 mL/hr at 03/03/18 0600   LTM EEG report. 04/01/2018 at 07  45 am to 03/03/18 at 07 30 am  Clinical interpretation: 24 hours of intensive EEG monitoring with simultaneous video monitoring recorded 1 electrographic seizure without obvious clinical accompaniment around 1649.  Seizure lateralized to the right hemisphere localized to the right occipital cortex.  Several pushbutton activation events were not seizures. Right temporal muscle twitch artifact noted at T4 was not a seizure and due to artifact.    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 1. 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 Depakote level this morning (7/2) was low at 13.  3. 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 LTM EEG reveals one seizure during the past 24 hours, at 1649 yesterday. The subclinical, electrographic seizure lateralized to the right hemisphere and localized to the right occipital cortex.  Recommendations: -Continue Keppra IV 500 mg twice daily -Supplemental load of IV valproic acid, 10 mg/kg (ordered) --Increased maintenance dose IV valproic acid to 10 mg/kg TID  --Continue LTM EEG -Repeat Depakote level on Wed morning --As EEG official read shows no electrographic seizures for the past several hours, specifically none to correlate with this morning's observed jerking movements, goal from a neurological standpoint should be to wean propofol and observe to ensure no seizure recurrence --Frequent neuro checks --Seizure precautions --Discussed with family and ICU attending  40 minutes spent in the neurological evaluation and management of this critically ill patient   LOS: 1 day   @Electronically  signed: Dr. Kerney Elbe 03/03/2018  8:04 AM

## 2018-03-03 NOTE — Progress Notes (Addendum)
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  Most recently did dialysis on 6/30 in the evening hours.  This was without complication.  At baseline she uses a walker for ambulation.  In the morning hours of 7/1 she fell over backwards striking her head but did not lose consciousness.  EMS was called and she was in route to [facility], however, she began seizing in the ambulance was diverted to [facility].  She was postictal upon arrival to the emergency department, but then suffered an additional seizure episode which was abated with 2 mg of Ativan.  She was intubated at that time for airway protection.  She had a CT scan of the head, which demonstrated soft tissue swelling in the right posterior parietal region, but no acute intracranial abnormalities. She was given valproic acid in addition to her baseline Keppra.  He has had no clinical seizure activity overnight but I am told there are still anomalies on her EEG.  I have spoken with her sister who tells me that her dose of cyclosporine has been stable for many years.   PAST MEDICAL HISTORY :  She  has a past medical history of CNS lymphoma ([place]) (01/2018), End-stage renal disease on peritoneal dialysis [facility]), Heart transplant status ([place]) (1999), Hodgkin's disease ([place]), and Non Hodgkin's lymphoma ([place]).  PAST SURGICAL HISTORY: She  has a past surgical history that includes Heart transplant.  No Known Allergies  No current facility-administered medications on file prior to encounter.     Current Outpatient Medications on File Prior to Encounter  Medication Sig  . calcitRIOL (ROCALTROL) 0.5 MCG capsule Take 0.5 mcg by mouth daily.  . carvedilol (COREG) 25 MG tablet Take 25 mg by mouth 2 (two) times daily with a meal.  . cycloSPORINE modified (NEORAL) 25 MG capsule Take 25 mg by mouth 2 (two) times daily.  Marland Kitchen dexamethasone (DECADRON) 4 MG tablet Take 4 mg by mouth 3 (three) times daily.  Marland Kitchen gentamicin cream (GARAMYCIN) 0.1 % Apply 3-4 application topically daily.  Marland Kitchen lanthanum (FOSRENOL) 1000 MG chewable tablet Chew 1,000 mg by mouth See admin instructions. Take 1 tablet three times a day with meals and snacks 2 times daily  . levETIRAcetam (KEPPRA) 500 MG tablet Take 500 mg by mouth daily.  . mycophenolate (CELLCEPT) 250 MG capsule Take 250 mg by mouth 2 (two) times daily.  Marland Kitchen oxycodone (OXY-IR) 5 MG capsule Take 5-10 mg by mouth every 4 (four) hours as needed for pain.  . pantoprazole (PROTONIX) 40 MG tablet Take 40 mg by mouth daily.  . pravastatin (PRAVACHOL) 20 MG tablet Take 20 mg by mouth daily.    FAMILY HISTORY:  Her indicated that the status of her mother is unknown. She indicated that the status of her father is unknown.   SOCIAL HISTORY: She  has an unknown smoking status. She has never used smokeless tobacco.  REVIEW OF SYSTEMS:   Not obtainable from the patient  SUBJECTIVE:  As above  VITAL SIGNS: BP 120/87   Pulse 89  Temp 97.7 F (36.5 C)   Resp 18   Ht 5' (1.524 m)   Wt 133 lb 6.1 oz (60.5 kg)   SpO2 99%   BMI 26.05 kg/m   HEMODYNAMICS:    VENTILATOR SETTINGS: Vent Mode: PRVC FiO2 (%):  [40 %-60 %] 40 % Set Rate:  [18 bmp] 18 bmp Vt Set:  [360 mL] 360 mL PEEP:  [5 cmH20] 5 cmH20 Plateau Pressure:  [12 cmH20-19 cmH20] 12 cmH20  INTAKE / OUTPUT: I/O last 3 completed shifts: In: 407.5 [I.V.:119.8; IV Piggyback:287.7] Out: 35 [Urine:35]  PHYSICAL EXAMINATION: General: Thin chronically ill-appearing female who is orally intubated  mechanically ventilated and sedated on 50 mcg of propofol Neuro: There is no response to voice.  She is purposeful with the right upper extremity in response to sternal rub, she does not move the left, pupils are equal Cardiovascular: S1 and S2 are regular without murmur rub or gallop Lungs: There is symmetric air movement, no wheezes Abdomen: The abdomen is soft without organomegaly masses tenderness guarding or rebound Musculoskeletal: No dependent edema  LABS:  BMET Recent Labs  Lab 03/29/2018 1000 03/20/2018 1013 03/03/18 0317  NA 135 132* 134*  K 4.4 4.3 4.6  CL 94* 95* 96*  CO2 22  --  24  BUN 99* 98* 99*  CREATININE 6.47* 6.40* 6.13*  GLUCOSE 143* 136* 124*    Electrolytes Recent Labs  Lab 03/19/2018 1000 03/03/18 0317  CALCIUM 7.5* 7.1*  MG  --  1.4*  PHOS  --  7.2*    CBC Recent Labs  Lab 03/28/2018 1000 03/13/2018 1013 03/03/18 0317  WBC 9.9  --  4.8  HGB 12.0 12.9 9.2*  HCT 40.3 38.0 31.0*  PLT 44*  --  32*    Coag's Recent Labs  Lab 03/22/2018 1000  INR 1.12    Sepsis Markers No results for input(s): LATICACIDVEN, PROCALCITON, O2SATVEN in the last 168 hours.  ABG Recent Labs  Lab 03/04/2018 1341 03/03/18 0441  PHART 7.422 7.424  PCO2ART 42.7 37.4  PO2ART 109.0* 135.0*    Liver Enzymes Recent Labs  Lab 03/09/2018 1000  AST 18  ALT 8  ALKPHOS 112  BILITOT 0.8  ALBUMIN 2.4*    Cardiac Enzymes No results for input(s): TROPONINI, PROBNP in the last 168 hours.  Glucose Recent Labs  Lab 03/18/2018 0954  GLUCAP 137*    Imaging Ct Head Wo Contrast  Result Date: 03/24/2018 CLINICAL DATA:  Fall EXAM: CT HEAD WITHOUT CONTRAST CT CERVICAL SPINE WITHOUT CONTRAST TECHNIQUE: Multidetector CT imaging of the head and cervical spine was performed following the standard protocol without intravenous contrast. Multiplanar CT image reconstructions of the cervical spine were also generated. COMPARISON:  None. FINDINGS: CT HEAD FINDINGS Brain: Low-density  areas throughout the deep white matter. No hemorrhage or hydrocephalus. No acute infarction. Vascular: No hyperdense vessel or unexpected calcification. Skull: No acute calvarial abnormality. Sinuses/Orbits: Visualized paranasal sinuses and mastoids clear. Orbital soft tissues unremarkable. Other: Soft tissue swelling in the right posterior parietal region. CT CERVICAL SPINE FINDINGS Alignment: Normal Skull base and vertebrae: No fracture Soft tissues and spinal canal: No prevertebral fluid or swelling. No visible canal hematoma. Disc levels:  Maintained Upper chest: No acute findings Other: Large mass in the right thyroid lobe measures up to 5 cm. Smaller nodule in the left thyroid lobe measures 1.7 cm. Tracheostomy and NG tube are noted in place. IMPRESSION: Patchy low-density white matter disease bilaterally. This could reflect chronic microvascular ischemic changes or other  cause of demyelination such as vasculitis, MS. No acute intracranial abnormality. No acute bony abnormality in the cervical spine. Bilateral thyroid nodules and masses, right greater than left. This likely reflects multinodular goiter. This could be further characterized with elective thyroid ultrasound. Electronically Signed   By: Rolm Baptise M.D.   On: 03/28/2018 10:54   Ct Cervical Spine Wo Contrast  Result Date: 03/18/2018 CLINICAL DATA:  Fall EXAM: CT HEAD WITHOUT CONTRAST CT CERVICAL SPINE WITHOUT CONTRAST TECHNIQUE: Multidetector CT imaging of the head and cervical spine was performed following the standard protocol without intravenous contrast. Multiplanar CT image reconstructions of the cervical spine were also generated. COMPARISON:  None. FINDINGS: CT HEAD FINDINGS Brain: Low-density areas throughout the deep white matter. No hemorrhage or hydrocephalus. No acute infarction. Vascular: No hyperdense vessel or unexpected calcification. Skull: No acute calvarial abnormality. Sinuses/Orbits: Visualized paranasal sinuses and  mastoids clear. Orbital soft tissues unremarkable. Other: Soft tissue swelling in the right posterior parietal region. CT CERVICAL SPINE FINDINGS Alignment: Normal Skull base and vertebrae: No fracture Soft tissues and spinal canal: No prevertebral fluid or swelling. No visible canal hematoma. Disc levels:  Maintained Upper chest: No acute findings Other: Large mass in the right thyroid lobe measures up to 5 cm. Smaller nodule in the left thyroid lobe measures 1.7 cm. Tracheostomy and NG tube are noted in place. IMPRESSION: Patchy low-density white matter disease bilaterally. This could reflect chronic microvascular ischemic changes or other cause of demyelination such as vasculitis, MS. No acute intracranial abnormality. No acute bony abnormality in the cervical spine. Bilateral thyroid nodules and masses, right greater than left. This likely reflects multinodular goiter. This could be further characterized with elective thyroid ultrasound. Electronically Signed   By: Rolm Baptise M.D.   On: 03/21/2018 10:54   Dg Chest Port 1 View  Result Date: 03/03/2018 CLINICAL DATA:  Ventilator, seizures EXAM: PORTABLE CHEST 1 VIEW COMPARISON:  03/18/2018 FINDINGS: Prior median sternotomy and CABG. Heart is normal size. Small left pleural effusion with left lower lobe atelectasis or infiltrate, similar to prior study. Improving aeration in the right base with decreasing atelectasis. No pneumothorax. IMPRESSION: Continued left lower lobe atelectasis or consolidation with small left effusion. Improving aeration in the right base. Electronically Signed   By: Rolm Baptise M.D.   On: 03/03/2018 07:28   Dg Chest Portable 1 View  Result Date: 03/10/2018 CLINICAL DATA:  Recent fall and altered mental status EXAM: PORTABLE CHEST 1 VIEW COMPARISON:  None. FINDINGS: Cardiac shadow is within normal limits. Endotracheal tube is noted 1.4 cm above the carina. This could be withdrawn 1-2 cm as clinically indicated. Nasogastric catheter  extends into the stomach. Patchy changes are noted in the bases greater on the left than the right consistent with early infiltrate/atelectasis. No bony abnormality is noted. IMPRESSION: Bibasilar changes. Tubes and lines as described above. The endotracheal tube could be withdrawn 1-2 cm as clinically indicated. Electronically Signed   By: Inez Catalina M.D.   On: 03/05/2018 10:26     STUDIES:    CULTURES:   ANTIBIOTICS: None  SIGNIFICANT EVENTS:   LINES/TUBES:   DISCUSSION:      This is a 52 year old heart transplant patient who required a heart transplant more than 20 years ago due to toxic effects of Adriamycin following treatment for lymphoma.  She has subsequently been found to have a CNS lymphoma and was being transported to [facility] on 7/1 to be evaluated for therapy when she suffered from a generalized seizure  in route.  She was brought to our department of emergency medicine where she required intubation and mechanical ventilation  ASSESSMENT / PLAN:  PULMONARY A: Chest x-ray to my eye does not show acute disease however I have no plans to separate the patient from mechanical ventilation pending further input from neuro oncology and neurology as well as from the family regarding the desired level of care and whether or not they wish to proceed with palliation.  CARDIOVASCULAR A: No issues with hemodynamic instability at present RENAL A: Continues PD     GASTROINTESTINAL A: Prophylaxis is with Protonix  HEMATOLOGIC A: She has a significant thrombocytopenia with numerous potential provocations, I am simply going to follow the counts for now.  Continue DVT prophylaxis with unfractionated heparin for now  INFECTIOUS A: No active issues, I have reviewed her chest x-ray and I am not convinced that we are dealing with aspiration.  ENDOCRINE A: No issues NEUROLOGIC A: This is a 52 year old with CNS lymphoma in the setting of chronic immunosuppression for cardiac  transplant.  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  As there is a concern of subclinical seizure activity I am going to continue her on generous dose propofol for now pending further input from the neurology service.  She continues Keppra and Depakote.    FAMILY  -I spoke with her sister this morning who tells me that her current baseline performance status is extremely poor.  Family is favoring a palliative care evaluation.  I have placed a call to neuro oncology at [facility] to obtain their input.   Greater than 32 minutes was spent in the care of this potentially unstable patient today    Lars Masson, MD [place] Pager: [phone]  03/03/2018, 8:43 AM   Addendum: I was able to speak with Dr. Doylene Canard oncologist this morning as well as Sparrow's parents and sister.  Family reported an extremely poor performance status with the patient primarily just sitting in a chair sleeping all day.  They told me that the one time that she had met with Dr. Lars Pinks and expressed an interest in aggressive care for her CNS lymphoma was very atypical.  I shared with family Dr. Verda Cumins impression that chances of a meaningful response to therapy are only about 20 to 30% but there may be some value in exploring decreasing her immunosuppression and treating with Rituxan alone.  I also expressed my opinion that I felt that if she was able rub and should be allowed to make the decision as to the appropriate level of care.  We have subsequently been able to extubate Cheyenne Sanchez and she is communicative.  She tells me that she just would want to have a quiet night and will make a decision as to how aggressively to pursue her CNS lymphoma in the morning.  Should she have a catastrophic event it is her desire that we not reintubate her nor are we to perform CPR.

## 2018-03-03 NOTE — Procedures (Signed)
Extubation Procedure Note  Patient Details:   Name: Cheyenne Sanchez DOB: Feb 17, 1966 MRN: [id number]   Airway Documentation:    Vent end date: 03/03/18 Vent end time: 1445   Evaluation  O2 sats: stable throughout Complications: No apparent complications Patient did tolerate procedure well. Bilateral Breath Sounds: Clear, Diminished   No speaking, by pt's choice.    Pt placed on 3 L [place] with humidity, no stridor noted.  MD, RN, and family at bedside.  Cheyenne Sanchez 03/03/2018, 3:04 PM

## 2018-03-03 NOTE — Progress Notes (Signed)
Palliative consult received- left message for patient's sister- Cheyenne Sanchez, requesting return call to arrange time to meet.   Cheyenne Sanchez, AGNP-C Palliative Medicine  Please call Palliative Medicine team phone with any questions [phone]. For individual providers please see AMION.

## 2018-03-03 NOTE — Progress Notes (Signed)
  [facility] Progress Note    Subjective:   Pt extubated and resting comfortably in bed.  No complaints at this time.   Objective:   BP (!) 148/92   Pulse 87   Temp (!) 96.6 F (35.9 C)   Resp 14   Ht 5' (1.524 m)   Wt 60.5 kg (133 lb 6.1 oz)   SpO2 99%   BMI 26.05 kg/m   Intake/Output: I/O last 3 completed shifts: In: 417.3 [I.V.:129.6; IV Piggyback:287.7] Out: 35 [Urine:35]   Intake/Output this shift:  Total I/O In: 407.3 [I.V.:71.4; IV Piggyback:335.9] Out: 50 [Urine:50] Weight change:   Physical Exam: Gen: chronically ill appearing WF in NAD CVS: no rub Resp: decreased BS at bases Abd: benign, PD cath in LLQ Ext: 1+ edema  Labs: BMET Recent Labs  Lab 03/23/2018 1000 03/23/2018 1013 03/03/18 0317  NA 135 132* 134*  K 4.4 4.3 4.6  CL 94* 95* 96*  CO2 22  --  24  GLUCOSE 143* 136* 124*  BUN 99* 98* 99*  CREATININE 6.47* 6.40* 6.13*  ALBUMIN 2.4*  --   --   CALCIUM 7.5*  --  7.1*  PHOS  --   --  7.2*   CBC Recent Labs  Lab 03/23/2018 1000 04/01/2018 1013 03/03/18 0317  WBC 9.9  --  4.8  NEUTROABS 8.9*  --   --   HGB 12.0 12.9 9.2*  HCT 40.3 38.0 31.0*  MCV 97.6  --  97.2  PLT 44*  --  32*    '@IMGRELPRIORS'$ @ Medications:    . chlorhexidine gluconate (MEDLINE KIT)  15 mL Mouth Rinse BID  . cycloSPORINE  25 mg Per Tube BID  . dexamethasone  4 mg Intravenous TID  . gentamicin cream  1 application Topical Daily  . heparin  5,000 Units Subcutaneous Q8H  . mouth rinse  15 mL Mouth Rinse 10 times per day  . mycophenolate  250 mg Oral BID  . pantoprazole sodium  40 mg Per Tube Daily     Assessment/ Plan:   1. 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  She successfully underwent one way extubation and family would like to do CCPD tonight but will discuss hospice with Ms. Armen Pickup tomorrow as they are leaning towards comfort measures. 2. ESRD as above, will plan for CCPD tonight but  the family is leaning towards not pursuing ongoing PD afterwards 3. Anemia: drop in hgb 4. Heart transplant- tapering immunosuppressive agents. 5. Disposition- pt with CNS lymphoma recurrence and palliative have met with the family who agreed on a one-way extubation.  Pt is currently breathing on her own and talking.  She is now DNR and will revisit stopping CCPD tomorrow but will continue tonight.  Appreciate Palliative Care's assistance with this very difficult and sad situation.  Donetta Potts, MD [place] Pager [phone] 03/03/2018, 5:28 PM

## 2018-03-03 NOTE — Progress Notes (Signed)
Pt started having seizure like activity around 0620. Notified MD, versed given. Will continue to monitor.

## 2018-03-03 NOTE — Consult Note (Signed)
Consultation Note Date: 03/03/2018   Patient Name: Cheyenne Sanchez  DOB: 10-24-1965  MRN: [id number]  Age / Sex: 52 y.o., female  PCP: Cheyenne Sanchez., MD Referring Physician: Sampson Goon, MD  Reason for Consultation: Establishing goals of care  HPI/Patient Profile: 52 y.o. 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   Clinical Assessment and Goals of Care:  I have reviewed medical records including EPIC notes, labs and imaging, received report from Dr. Pearline Sanchez and patient's RN, assessed the patient and then met at the bedside along with patient's parents, and sister- Cheyenne Sanchez, and Cheyenne Sanchez, to discuss diagnosis prognosis, GOC, EOL wishes, disposition and options.  I introduced Palliative Medicine as specialized medical care for people living with serious illness. It focuses on providing relief from the symptoms and stress of a serious illness. The goal is to improve quality of life for both the patient and the family.  We discussed a brief life review of the patient. 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 She is bilingual, speaking [place] and [place]. Her family describes her as very loving, caring. She has spent a great deal of her life being ill. In the last few weeks she had expressed to her sister she was sick of being sick.  As far as functional and nutritional status- there has been significant decline in the last few months and even more in the last few weeks. At home patient was requiring assistance with toileting and ADL's. She used a rolling  walker, but was primarily living a bed to chair existence. She was sleeping more during the day than she was awake.  She had stopped eating, would eat small amounts, mostly sweets.  She left the house only for doctor appointments. Her family states she was becoming more and more withdrawn from them. She would use a great deal of energy to speak with medical providers and go to to dialysis. But then would quickly return to her withdrawn Sanchez. They noticed her cognition was changing as well. She was hallucinating, seeing birds flying around (likely from Cheyenne Sanchez).    We discussed their current illness and what it means in the larger context of their on-going co-morbidities.  Natural disease trajectory and expectations at EOL were discussed. Family is aware of patient's poor prognosis regarding CNS lymphoma in combination with her renal failure, poor functional status, and questionable ability to tolerate any sort of treatment. They Sanchez that she had often stated "just give me the milky white stuff and some ativan and let me go". Her sister states that "she would be pissed" if she knew she was on a ventilator right now.   The difference between aggressive medical intervention and  comfort care was considered in light of the patient's goals of care. Patient's mother, father, and sister, all agree that they do not believe that further chemotherapy would improve Cheyenne Sanchez that she would want to be be. They feel she has been declining significantly for the last few weeks and has been preparing herself and them for her death. She has told them "there are no more miracles", has been making funeral plans, and has discussed EOL wishes with her sister, Cheyenne Sanchez. Cheyenne Sanchez has expressed a desire to die peacefully, without anxiety, or pain, she has told her sister she really just wants to be unaware of what is going on.  Advanced directives, concepts specific to code status, artifical feeding and hydration,  and rehospitalization were considered and discussed. Patient is currently intubated with partial code status- if she is extubated, and her respiratory status were to decline, they would not want her reintubated. If she is unable to eat or drink on her own, they would not want artificial feeding or drinking.   The issue of her dialysis complicates the situation a bit. Cheyenne Sanchez and Cheyenne Sanchez's Dad feel that it would honor Cheyenne Sanchez's wishes for a peaceful, not prolonged comfortable end of life process if dialysis were stopped. Cheyenne Sanchez (patient's Mom), would like to talk to her priest before deciding. Family is very supportive of them all making decisions together and feeling good about their decisions.    We did discuss that Cheyenne Sanchez may breath on her own after extubation and be able to wake up and participate in further [place] regarding her dialysis and chemotherapy treatment. Family is in agreement to this, however, they do now want any comfort medication held for that intent. Their primary concern is for Cheyenne Sanchez comfort.   Hospice services were explained and offered.  Questions and concerns were addressed.  Hard Choices booklet left for review. The family was encouraged to call with questions or concerns.    Primary Decision Maker NEXT OF KIN- Cheyenne Sanchez's parents    SUMMARY OF RECOMMENDATIONS -DNR code status- when patient is extubated- no reintubation -No trach, no PEG -Family states that they're primary [place] is patient's comfort- they are ready to proceed with one way extubation, however, are struggling with further GOC including continuing dialysis -Patient's parents are visiting with their priest and continue discussions with care team re: Cheyenne Sanchez  -Family does not believe that transferring to [facility] and proceeding with chemotherapy would improve patient's functional status or quality of life- they spoke with Dr. Maylon Sanchez via phone and got the impression patient may not be candidate for further treatment as is- based on  patient's previous low functional status and current Sanchez, I would agree -Patient received Last Rites from parish priest last night  -Prognosis difficult to determine until patient is extubated- if she is unable to support her airway, then prognosis would be hours, if she is able to support her airway and resume consciousness off sedation- then further discussion will be needed re: [place]  Code Status/Advance Care Planning:  DNR  Psycho-social/Spiritual:   Desire for further Chaplaincy support:no  Additional Recommendations: Grief/Bereavement Support  Prognosis:    Unable to determine will be based on patient's status off of ventilator  Discharge Planning: To Be Determined  Primary Diagnoses: Present on Admission: **None**   I have reviewed the medical record, interviewed the patient and family, and examined the patient. The following aspects are pertinent.  Past Medical History:  Diagnosis Date  . CNS lymphoma ([place]) 01/2018  .  End-stage renal disease on peritoneal dialysis ([place])   . 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 Hodgkin's disease ([place])   . Non Hodgkin's lymphoma ([place])    Social History   Socioeconomic History  . Marital status: Single    Sanchez name: Not on file  . Number of children: Not on file  . Years of education: Not on file  . Highest education level: Not on file  Occupational History  . Not on file  Social Needs  . Financial resource strain: Not on file  . Food insecurity:    Worry: Not on file    Inability: Not on file  . Transportation needs:    Medical: Not on file    Non-medical: Not on file  Tobacco Use  . Smoking status: Unknown If Ever Smoked  . Smokeless tobacco: Never Used  Substance and Sexual Activity  . Alcohol use: Not on file  . Drug use: Not on file  . Sexual activity: Not on file  Lifestyle  . Physical activity:    Days per week: Not on file    Minutes per session: Not on file  .  Stress: Not on file  Relationships  . Social connections:    Talks on phone: Not on file    Gets together: Not on file    Attends religious service: Not on file    Active member of club or organization: Not on file    Attends meetings of clubs or organizations: Not on file    Relationship status: Not on file  Other Topics Concern  . Not on file  Social History Narrative  . Not on file   Family History  Problem Relation Age of Onset  . Hypertension Mother   . Hypertension Father    Scheduled Meds: . chlorhexidine gluconate (MEDLINE KIT)  15 mL Mouth Rinse BID  . cycloSPORINE  25 mg Per Tube BID  . dexamethasone  4 mg Intravenous TID  . gentamicin cream  1 application Topical Daily  . heparin  5,000 Units Subcutaneous Q8H  . mouth rinse  15 mL Mouth Rinse 10 times per day  . mycophenolate  250 mg Oral BID  . pantoprazole sodium  40 mg Per Tube Daily   Continuous Infusions: . sodium chloride    . sodium chloride    . dialysis solution 1.5% low-MG/low-CA    . dialysis solution 2.5% low-MG/low-CA    . levETIRAcetam 500 mg (03/03/18 1200)  . propofol (DIPRIVAN) infusion 30 mcg/kg/min (03/03/18 1200)  . valproate sodium     PRN Meds:.sodium chloride, acetaminophen, fentaNYL (SUBLIMAZE) injection, fentaNYL (SUBLIMAZE) injection, dianeal solution for CAPD/CCPD with heparin, dianeal solution for CAPD/CCPD with heparin, hydrALAZINE, midazolam Medications Prior to Admission:  Prior to Admission medications   Medication Sig Start Date End Date Taking? Authorizing Provider  calcitRIOL (ROCALTROL) 0.5 MCG capsule Take 0.5 mcg by mouth daily.   Yes [provider]  carvedilol (COREG) 25 MG tablet Take 25 mg by mouth 2 (two) times daily with a meal.   Yes [provider]  cycloSPORINE modified (NEORAL) 25 MG capsule Take 25 mg by mouth 2 (two) times daily.   Yes [provider]  dexamethasone (DECADRON) 4 MG tablet Take 4 mg by mouth 3 (three) times daily.   Yes  [provider]  gentamicin cream (GARAMYCIN) 0.1 % Apply 3-4 application topically daily.   Yes [provider]  lanthanum (FOSRENOL) 1000 MG chewable  tablet Chew 1,000 mg by mouth See admin instructions. Take 1 tablet three times a day with meals and snacks 2 times daily   Yes [provider]  levETIRAcetam (KEPPRA) 500 MG tablet Take 500 mg by mouth daily.   Yes [provider]  mycophenolate (CELLCEPT) 250 MG capsule Take 250 mg by mouth 2 (two) times daily.   Yes [provider]  oxycodone (OXY-IR) 5 MG capsule Take 5-10 mg by mouth every 4 (four) hours as needed for pain.   Yes [provider]  pantoprazole (PROTONIX) 40 MG tablet Take 40 mg by mouth daily.   Yes [provider]  pravastatin (PRAVACHOL) 20 MG tablet Take 20 mg by mouth daily.   Yes [provider]   No Known Allergies Review of Systems  Unable to perform ROS: Intubated    Physical Exam  Cardiovascular: Normal rate.  Pulmonary/Chest:  intubated  Skin: There is pallor.  Psychiatric:  sedated  Nursing note and vitals reviewed.   Vital Signs: BP 133/90   Pulse 82   Temp (!) 97 F (36.1 C)   Resp 18   Ht 5' (1.524 m)   Wt 60.5 kg (133 lb 6.1 oz)   SpO2 100%   BMI 26.05 kg/m  Pain Scale: CPOT   Pain Score: 0-No pain   SpO2: SpO2: 100 % O2 Device:SpO2: 100 % O2 Flow Rate: .   IO: Intake/output summary:   Intake/Output Summary (Last 24 hours) at 03/03/2018 1326 Last data filed at 03/03/2018 1200 Gross per 24 hour  Intake 462.73 ml  Output 60 ml  Net 402.73 ml    LBM: Last BM Date: (pta) Baseline Weight: Weight: 57.2 kg (126 lb) Most recent weight: Weight: 60.5 kg (133 lb 6.1 oz)     Palliative Assessment/Data: PPS: 10%     Thank you for this consult. Palliative medicine will continue to follow and assist as needed.   Time In: 1230 Time Out: 1400 Time Total: 90 minutes Greater than 50%  of this time was spent counseling  and coordinating care related to the above assessment and plan.  Signed by: Mariana Kaufman, AGNP-C Palliative Medicine    Please contact Palliative Medicine Team phone at [phone] for questions and concerns.  For individual provider: See Shea Evans

## 2018-03-03 NOTE — Procedures (Signed)
  Electroencephalogram report- LTM   Data acquisition: 10-20 electrode placement.  Additional T1, T2, and EKG electrodes; 26 channel digital referential acquisition reformatted to 18 channel/7 channel coronal bipolar  Spike detection: ON  Seizure detection: ON   Beginning time: 03/29/2018 at 07 45 am  Ending time: 03/03/18 at 07 30 am  CPT: 95951 Day of study: day 1    This 23 hours of intensive EEG monitoring with simultaneous video monitoring was performed for this patient with spells convulsions  as a part of ongoing series to capture events of interest and determine if these are seizures.   Medications: as per EMR Patient is intubated.  Sedation status is not available.  Background activities marked by continuous delta slowing with superimposed faster frequencies in the beta range distributed broadly with paracentral dominance.  More prominent delta slowing and lack of faster frequencies appeared to be across right hemisphere.  Several pushbutton activation events around 6 AM noted.  Activated by staff and response to subtle body movements.  Patient appeared to be aroused during that time stimulated due to some type of nursing procedures or vital signs check in.  At that time background activities changes into more prominent delta slowing distributed broadly.  There was no ictal pattern to suggest seizure.  Intermittently there is a muscle twitch artifact particularly present right temporal (T4) and sometimes to lesser extent left temporal region.  That was not a seizure.  Patient however did have subclinical electrographic seizure at 1649.  There was no clinical accompaniment.  Electrographically seizure manifested as a buildup of right occipital low amplitude polyspike and wave discharges involving in morphology frequency amplitude however remain confined to the right occipital region for quite a while until there is a recruitment of adjacent right posterior cortex right parietal and right  parietal paracentral region with negative field extending to the midline and the left parieto-occipital cortex until cessation of ictal activities.  Clinical interpretation: This 24 hours of intensive EEG monitoring with simultaneous monitoring recorded 1 electrographic seizure without obvious clinical accompaniment around 1649.  Seizure lateralized to the right hemisphere localized to the right occipital cortex please see above discussion.  Several pushbutton activation events however were not seizures . Right temporal muscle twitch artifact noted at T4 was not a seizure and due to artifact.  These findings were discussed with treating team at the time of interpretation.  Clinical correlation is advised.

## 2018-03-04 ENCOUNTER — Encounter (HOSPITAL_COMMUNITY): Payer: Self-pay

## 2018-03-04 ENCOUNTER — Other Ambulatory Visit: Payer: Self-pay

## 2018-03-04 DIAGNOSIS — G40901 Epilepsy, unspecified, not intractable, with status epilepticus: Secondary | ICD-10-CM

## 2018-03-04 DIAGNOSIS — Z992 Dependence on renal dialysis: Secondary | ICD-10-CM

## 2018-03-04 DIAGNOSIS — N186 End stage renal disease: Secondary | ICD-10-CM

## 2018-03-04 MED ORDER — LORAZEPAM 2 MG/ML PO CONC: 1.0000 mg | ORAL | Status: DC | PRN

## 2018-03-04 MED ORDER — LORAZEPAM 2 MG/ML IJ SOLN
1.0000 mg | Freq: Three times a day (TID) | INTRAMUSCULAR | Status: DC
  Administered 2018-03-04 – 2018-03-07 (×9): 1 mg via INTRAVENOUS
  Filled 2018-03-04 (×9): qty 1

## 2018-03-04 MED ORDER — GLYCOPYRROLATE 0.2 MG/ML IJ SOLN: 0.2000 mg | INTRAMUSCULAR | Status: DC | PRN

## 2018-03-04 MED ORDER — LORAZEPAM 2 MG/ML IJ SOLN
0.5000 mg | Freq: Two times a day (BID) | INTRAMUSCULAR | Status: DC
  Administered 2018-03-04: 0.5 mg via INTRAVENOUS

## 2018-03-04 MED ORDER — ACETAMINOPHEN 325 MG PO TABS: 650.0000 mg | ORAL_TABLET | Freq: Four times a day (QID) | ORAL | Status: DC | PRN

## 2018-03-04 MED ORDER — LORAZEPAM 2 MG/ML IJ SOLN
0.5000 mg | Freq: Once | INTRAMUSCULAR | Status: AC
  Administered 2018-03-04: 0.5 mg via INTRAVENOUS
  Filled 2018-03-04: qty 1

## 2018-03-04 MED ORDER — HALOPERIDOL 0.5 MG PO TABS: 0.5000 mg | ORAL_TABLET | ORAL | Status: DC | PRN

## 2018-03-04 MED ORDER — ACETAMINOPHEN 650 MG RE SUPP: 650.0000 mg | Freq: Four times a day (QID) | RECTAL | Status: DC | PRN

## 2018-03-04 MED ORDER — ONDANSETRON HCL 4 MG/2ML IJ SOLN: 4.0000 mg | Freq: Four times a day (QID) | INTRAMUSCULAR | Status: DC | PRN

## 2018-03-04 MED ORDER — HALOPERIDOL LACTATE 2 MG/ML PO CONC
0.5000 mg | ORAL | Status: DC | PRN
  Filled 2018-03-04: qty 0.3

## 2018-03-04 MED ORDER — HALOPERIDOL LACTATE 5 MG/ML IJ SOLN: 0.5000 mg | INTRAMUSCULAR | Status: DC | PRN

## 2018-03-04 MED ORDER — ALPRAZOLAM 0.25 MG PO TABS: 0.2500 mg | ORAL_TABLET | Freq: Three times a day (TID) | ORAL | Status: DC | PRN

## 2018-03-04 MED ORDER — LORAZEPAM 1 MG PO TABS: 1.0000 mg | ORAL_TABLET | ORAL | Status: DC | PRN

## 2018-03-04 MED ORDER — ONDANSETRON 4 MG PO TBDP: 4.0000 mg | ORAL_TABLET | Freq: Four times a day (QID) | ORAL | Status: DC | PRN

## 2018-03-04 MED ORDER — GLYCOPYRROLATE 1 MG PO TABS
1.0000 mg | ORAL_TABLET | ORAL | Status: DC | PRN
  Filled 2018-03-04: qty 1

## 2018-03-04 MED ORDER — HYDROMORPHONE HCL 1 MG/ML IJ SOLN: 0.5000 mg | INTRAMUSCULAR | Status: DC | PRN

## 2018-03-04 MED ORDER — POLYVINYL ALCOHOL 1.4 % OP SOLN
1.0000 [drp] | Freq: Four times a day (QID) | OPHTHALMIC | Status: DC | PRN
  Filled 2018-03-04: qty 15

## 2018-03-04 MED ORDER — LORAZEPAM 2 MG/ML IJ SOLN
INTRAMUSCULAR | Status: AC
  Filled 2018-03-04: qty 1

## 2018-03-04 MED ORDER — BIOTENE DRY MOUTH MT LIQD: 15.0000 mL | OROMUCOSAL | Status: DC | PRN

## 2018-03-04 MED ORDER — LORAZEPAM 2 MG/ML IJ SOLN
1.0000 mg | INTRAMUSCULAR | Status: DC | PRN
  Administered 2018-03-07: 1 mg via INTRAVENOUS
  Filled 2018-03-04: qty 1

## 2018-03-04 NOTE — Progress Notes (Signed)
Received patient from 4N. Patient alert and oriented x4 at times, other times alert to self. Patient made comfortable in bed. Will continue to monitor. Patient's family at bedside.

## 2018-03-04 NOTE — Progress Notes (Addendum)
PULMONARY / CRITICAL CARE MEDICINE   Name: Cheyenne Sanchez MRN: [id number] DOB: 12/05/1965    ADMISSION DATE:  03/31/2018  HISTORY OF PRESENT ILLNESS:      52 year old female with past medical history as below, which is significant for non-hodgkin's lymphoma, heart transport 20 years ago (after chemo caused heart failure), and renal failure requiring peritoneal dialysis. 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  Most recently did dialysis on 6/30 in the evening hours.  This was without complication.  At baseline she uses a walker for ambulation.  In the morning hours of 7/1 she fell over backwards striking her head but did not lose consciousness.  EMS was called and she was in route to [facility], however, she began seizing in the ambulance was diverted to [facility].  She was postictal upon arrival to the emergency department, but then suffered an additional seizure episode which was abated with 2 mg of Ativan.  She was intubated at that time for airway protection.  She had a CT scan of the head, which demonstrated soft tissue swelling in the right posterior parietal region, but no acute intracranial abnormalities. She was given valproic acid in addition to her baseline Keppra.  She was extubated yesterday and this is been well-tolerated overnight.  She was left on a small dose of Precedex at her request due to some anxiety.  Her mental status has been waxing and waning but she is very oriented for me today.  He has had no further seizure activity.  He has some productive cough but does not complain of dyspnea.  During my examination I discussed with her whether or not she would to pursue more chemotherapy and again she is told me that she did not find the toxicity of her original rounds of chemotherapy terrifically bothersome and she might be interested in pursuing therapy.  At other times she is told her sister  she is ready to go.   PAST MEDICAL HISTORY :  She  has a past medical history of CNS lymphoma ([place]) (01/2018), End-stage renal disease on peritoneal dialysis [facility]), Heart transplant status ([place]) (1999), Hodgkin's disease ([place]), and Non Hodgkin's lymphoma ([employer]).  PAST SURGICAL HISTORY: She  has a past surgical history that includes Heart transplant.  No Known Allergies  No current facility-administered medications on file prior to encounter.    Current Outpatient Medications on File Prior to Encounter  Medication Sig  . calcitRIOL (ROCALTROL) 0.5 MCG capsule Take 0.5 mcg by mouth daily.  . carvedilol (COREG) 25 MG tablet Take 25 mg by mouth 2 (two) times daily with a meal.  . cycloSPORINE modified (NEORAL) 25 MG capsule Take 25 mg by mouth 2 (two) times daily.  Marland Kitchen dexamethasone (DECADRON) 4 MG tablet Take 4 mg by mouth 3 (three) times daily.  Marland Kitchen gentamicin cream (GARAMYCIN) 0.1 % Apply 3-4 application topically daily.  Marland Kitchen lanthanum (FOSRENOL) 1000 MG chewable tablet Chew 1,000 mg by mouth See admin instructions. Take 1 tablet three times a day with meals and snacks 2 times daily  . levETIRAcetam (KEPPRA) 500 MG tablet Take 500 mg by mouth daily.  . mycophenolate (CELLCEPT) 250 MG capsule Take 250 mg by mouth 2 (two) times daily.  Marland Kitchen oxycodone (OXY-IR) 5 MG capsule Take 5-10 mg by mouth every 4 (four) hours as needed for pain.  . pantoprazole (PROTONIX) 40 MG tablet Take 40 mg by mouth  daily.  . pravastatin (PRAVACHOL) 20 MG tablet Take 20 mg by mouth daily.    FAMILY HISTORY:  Her indicated that the status of her mother is unknown. She indicated that the status of her father is unknown.   SOCIAL HISTORY: She  has an unknown smoking status. She has never used smokeless tobacco.  REVIEW OF SYSTEMS:   Not obtainable from the patient  SUBJECTIVE:  As above  VITAL SIGNS: BP (!) 142/95   Pulse 95   Temp (!) 96.1 F (35.6 C)   Resp 15   Ht 5' (1.524 m)   Wt 133 lb 2.5 oz (60.4 kg)    SpO2 93%   BMI 26.01 kg/m   HEMODYNAMICS:    VENTILATOR SETTINGS: Vent Mode: PRVC FiO2 (%):  [40 %] 40 % Set Rate:  [18 bmp] 18 bmp Vt Set:  [360 mL] 360 mL PEEP:  [5 cmH20] 5 cmH20 Plateau Pressure:  [14 cmH20] 14 cmH20  INTAKE / OUTPUT: I/O last 3 completed shifts: In: 1052.6 [I.V.:276.9; IV Piggyback:775.7] Out: 85 [Urine:85]  PHYSICAL EXAMINATION: General: This is a thin chronically ill-appearing female who is very conversant almost garrulous.  She is in no obvious distress. Neuro: She is remarkably oriented and responding appropriately during my exam.   Cardiovascular: 2 are regular without murmur rub or gallop  Lungs: Respirations are unlabored, there is symmetric air movement, no wheezes.   Abdomen: The abdomen is soft without any tenderness guarding or rebound.   Musculoskeletal: No dependent edema  LABS:  BMET Recent Labs  Lab 03/24/2018 1000 03/12/2018 1013 03/03/18 0317  NA 135 132* 134*  K 4.4 4.3 4.6  CL 94* 95* 96*  CO2 22  --  24  BUN 99* 98* 99*  CREATININE 6.47* 6.40* 6.13*  GLUCOSE 143* 136* 124*    Electrolytes Recent Labs  Lab 03/27/2018 1000 03/03/18 0317  CALCIUM 7.5* 7.1*  MG  --  1.4*  PHOS  --  7.2*    CBC Recent Labs  Lab 03/06/2018 1000 03/15/2018 1013 03/03/18 0317  WBC 9.9  --  4.8  HGB 12.0 12.9 9.2*  HCT 40.3 38.0 31.0*  PLT 44*  --  32*    Coag's Recent Labs  Lab 03/13/2018 1000  INR 1.12    Sepsis Markers No results for input(s): LATICACIDVEN, PROCALCITON, O2SATVEN in the last 168 hours.  ABG Recent Labs  Lab 04/01/2018 1341 03/03/18 0441  PHART 7.422 7.424  PCO2ART 42.7 37.4  PO2ART 109.0* 135.0*    Liver Enzymes Recent Labs  Lab 03/17/2018 1000  AST 18  ALT 8  ALKPHOS 112  BILITOT 0.8  ALBUMIN 2.4*    Cardiac Enzymes No results for input(s): TROPONINI, PROBNP in the last 168 hours.  Glucose Recent Labs  Lab 03/12/2018 0954  GLUCAP 137*    Imaging No results found.   STUDIES:     CULTURES:   ANTIBIOTICS: None  SIGNIFICANT EVENTS:   LINES/TUBES:   DISCUSSION:      This is a 52 year old heart transplant patient who required a heart transplant more than 20 years ago due to toxic effects of Adriamycin following treatment for lymphoma.  She has subsequently been found to have a CNS lymphoma and was being transported to [facility] on 7/1 to be evaluated for therapy when she suffered from a generalized seizure in route.  She was brought to our department of emergency medicine where she required intubation and mechanical ventilation  ASSESSMENT / PLAN:  PULMONARY A: Chest  x-ray today is essentially unchanged.  Extubation has been well-tolerated.  CARDIOVASCULAR A: No issues with hemodynamic instability at present RENAL A: She continues peritoneal dialysis for chronic renal insufficiency      GASTROINTESTINAL A: Prophylaxis is with Protonix  HEMATOLOGIC A: She has a significant thrombocytopenia with numerous potential provocations, I am simply going to follow the counts for now.  Continue DVT prophylaxis with unfractionated heparin for now  INFECTIOUS A: No active issues, I have reviewed her chest x-ray and I am not convinced that we are dealing with aspiration.  ENDOCRINE A: No issues NEUROLOGIC A: This is a 52 year old with a CNS lymphoma in the setting of chronic immunosuppression for cardiac transplant.  She presented for seizures and transient and required intubation and mechanical ventilation.  She continues to be seizure-free on a combination of Keppra and Depakote. Whether it is appropriate to pursue therapy for her CNS lymphoma is not yet clear.  There is an issue of her performance status going into therapy, but there is also an issue of her being somewhat variable in her expression of her desires.  I will be again discussing her desires with family present in an attempt to formulate a plan of care.  Lars Masson, MD Critical Care  Medicine [place] Pager: [phone]  03/04/2018, 10:34 AM   I have repeatedly queried Ramesha about the level of care that she desires and get no consistent answers from her.  In addition she does not recall previous conversations when I converse with her.  She appears not to be competent to direct her own care and as such I think it is appropriate to accept the direction of her family and proceed with transfer to hospice.

## 2018-03-04 NOTE — Social Work (Signed)
At family request CSW made referral to [facility] liaison Audrea Muscat, referral made for placement at [facility]. There is unfortunately not bed available today. Will f/u with liaison tomorrow in regards to availability.   Alexander Mt, [place] Work [phone]

## 2018-03-04 NOTE — Progress Notes (Addendum)
Daily Progress Note   Patient Name: Cheyenne Sanchez       Date: 03/04/2018 DOB: 03-21-1966  Age: 52 y.o. MRN#: [id number] Attending Physician: Cheyenne Goon, MD Primary Care Physician: Cheyenne Mina., MD Admit Date: 03/11/2018  Reason for Consultation/Follow-up: Establishing goals of care, Hospice Evaluation, Non pain symptom management and Psychosocial/spiritual support  Subjective: Lengthy conversation with patient, sister, brother in law, parents.  Dr. Marval Sanchez came into the conversation mid way thru.    Family is experiencing anguish.  Cheyenne Sanchez's greatest fear is that she would lose her very sharp mind.  She has been suffering with hallucinations and agitation prior to coming into the hospital in status epilepticus.  According to Cheyenne Sanchez her quality of life was poor - at home I was able to sit and watch TV.    Sister accurately described Cheyenne Sanchez's mental status - she is very sharp and can answer questions correctly, but she seems to talk in circles and cant grasp concepts.  She will ask the same question over and over again.   The family is distressed that Cheyenne Sanchez is trying so hard to maintain control yet she is unable to do so.  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  Dr. Marval Sanchez pointed out that it was likely caused by her heart transplant anti-rejection medications.  These medications need to be discontinued.  There is a chance that she will suffer organ rejection.  He explained to the family that stopping PD was a very peaceful way to die and it would most likely be days to weeks.   As Cheyenne Sanchez has no desire to eat or drink she is likely to pass away quickly.  Family wants what is best for Cheyenne Sanchez.  They do not want her to suffer.  They want her comfortable.  Her father  brought up ativan as a means to reduce her anxiety.  We discussed hospice house.  The family would like to talk with a close friend that works for [name], but they will most likely choose to discharge to hospice house.   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  Required intubation.  Now extubated.  The patient and family have chosen to stop peritoneal dialysis and pursue Hospice and  comfort measures.  Length of Stay: 2  Current Medications: Scheduled Meds:  . dexamethasone  4 mg Intravenous TID  . gentamicin cream  1 application Topical Daily  . LORazepam  0.5 mg Intravenous Q12H    Continuous Infusions: . sodium chloride    . sodium chloride 10 mL/hr at 03/04/18 1000  . levETIRAcetam 500 mg (03/04/18 1225)  . valproate sodium Stopped (03/04/18 0553)    PRN Meds: sodium chloride, acetaminophen **OR** acetaminophen, antiseptic oral rinse, glycopyrrolate **OR** glycopyrrolate **OR** glycopyrrolate, [DISCONTINUED] haloperidol **OR** haloperidol **OR** haloperidol lactate, hydrALAZINE, HYDROmorphone (DILAUDID) injection, LORazepam **OR** [DISCONTINUED] LORazepam **OR** LORazepam, ondansetron **OR** ondansetron (ZOFRAN) IV, polyvinyl alcohol  Physical Exam       Well developed thin chronically ill appearing female with poor coloring.   Awake and orientated x 3 - but is unable to grasp significant concepts in conversation CV rrr Resp no distress, malodorous breath Abdomen soft   Vital Signs: BP (!) 134/92 (BP Location: Right Arm)   Pulse (!) 102   Temp 97.7 F (36.5 C) (Core)   Resp 14   Ht 5' (1.524 m)   Wt 60.4 kg (133 lb 2.5 oz)   SpO2 93%   BMI 26.01 kg/m  SpO2: SpO2: 93 % O2 Device: O2 Device: Nasal Cannula O2 Flow Rate: O2 Flow Rate (L/min): 3 L/min  Intake/output summary:   Intake/Output Summary (Last 24 hours) at 03/04/2018 1259 Last data filed at 03/04/2018 1000 Gross per 24 hour   Intake 728.08 ml  Output 60 ml  Net 668.08 ml   LBM: Last BM Date: (pta) Baseline Weight: Weight: 57.2 kg (126 lb) Most recent weight: Weight: 60.4 kg (133 lb 2.5 oz)       Palliative Assessment/Data: 20%      Patient Active Problem List   Diagnosis Date Noted  . CNS lymphoma ([name])   . Advance care planning   . Palliative care by specialist   . Goals of care, counseling/discussion   . Seizure ([name]) 03/20/2018  . Hodgkin's disease [name])     Palliative Care Plan    Recommendations/Plan:  Patient and family have chosen to discontinue peritoneal dialysis and pursue comfort thru hospice  Will change orders to focus on comfort  Will transfer out of ICU to a Palliative bed.  Social work consulted for transfer to [name] and Additional Recommendations:  Limitations on Scope of Treatment: Full Comfort Care  Code Status:  DNR  Prognosis:   < 2 weeks stopping peritoneal dialysis   Discharge Planning:  [name] was discussed with patient, family, nephrology, bedside RN, CCM attending.  Thank you for allowing the Palliative Medicine Team to assist in the care of this patient.  Total time spent:  90 min. Time in:  10:30 Time out: 12:00    Greater than 50%  of this time was spent counseling and coordinating care related to the above assessment and plan.  Cheyenne Jenny, PA-C Palliative Medicine  Please contact Palliative MedicineTeam phone at [phone] for questions and concerns between 7 am - 7 pm.   Please see AMION for individual provider pager numbers.

## 2018-03-04 NOTE — Progress Notes (Signed)
   03/04/18 1200  Clinical Encounter Type  Visited With Patient;Family;Health care provider  Visit Type Spiritual support;Social support;Follow-up  Referral From Palliative care team  Consult/Referral To Chaplain  Spiritual Encounters  Spiritual Needs Emotional;Grief support  Stress Factors  Patient Stress Factors Major life changes  Family Stress Factors Family relationships;Health changes;Major life changes  Chaplain visited with the family as a follow up visit from the ED 2 days prior.  Palliative care was meeting with the family at the time.  Chaplain met with the family to talk about their needs as it relates to the PT.  The Chaplain established the family needs were informational but also ones of grace.  The family has to decide about hospice care facility for the PT.  Chaplain leaned into talking about righteousness and the grace of God in this situation.

## 2018-03-04 NOTE — Progress Notes (Signed)
  Cheyenne Sanchez Progress Note    Subjective:   No complaints but remains fatigued   Objective:   BP (!) 134/92 (BP Location: Right Arm)   Pulse (!) 102   Temp 97.7 F (36.5 C) (Core)   Resp 14   Ht 5' (1.524 m)   Wt 60.4 kg (133 lb 2.5 oz)   SpO2 93%   BMI 26.01 kg/m   Intake/Output: I/O last 3 completed shifts: In: 1052.6 [I.V.:276.9; IV Piggyback:775.7] Out: 85 [Urine:85]   Intake/Output this shift:  Total I/O In: 63.5 [I.V.:63.5] Out: -  Weight change: 3.247 kg (7 lb 2.5 oz)  Physical Exam: OFH:[id number] ill appearing WF CVS: tachy Resp: cta DIY:MEBRAX Ext: 1+ edema  Labs: BMET Recent Labs  Lab 04/01/2018 1000 03/14/2018 1013 03/03/18 0317  NA 135 132* 134*  K 4.4 4.3 4.6  CL 94* 95* 96*  CO2 22  --  24  GLUCOSE 143* 136* 124*  BUN 99* 98* 99*  CREATININE 6.47* 6.40* 6.13*  ALBUMIN 2.4*  --   --   CALCIUM 7.5*  --  7.1*  PHOS  --   --  7.2*   CBC Recent Labs  Lab 03/19/2018 1000 03/30/2018 1013 03/03/18 0317  WBC 9.9  --  4.8  NEUTROABS 8.9*  --   --   HGB 12.0 12.9 9.2*  HCT 40.3 38.0 31.0*  MCV 97.6  --  97.2  PLT 44*  --  32*    '@IMGRELPRIORS'$ @ Medications:    . dexamethasone  4 mg Intravenous TID  . gentamicin cream  1 application Topical Daily  . LORazepam  1 mg Intravenous Q8H     Assessment/ Plan:   1. 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  Neuro is following and administrating IV anti-epileptics 2. CNS lymphoma, recurrent-  She successfully underwent one way extubation and family met with Palliative Care today and are transitioning to comfort measures.  She would like to stop CCPD and understands the consequences.  I concur with her decision and family is supportive.  . 3. ESRD as above, will stop CCPD as she is transitioning to comfort measures.   4. Anemia: drop in hgb 5. Heart transplant- tapering immunosuppressive agents. 6. Disposition- pt with CNS  lymphoma recurrence and palliative have met with the family and pt who all agreed to transition to comfort measures.  Pt is currently breathing on her own and talking.  Appreciate Palliative Care's assistance with this very difficult and sad situation.  Please call with any questions or concerns.     Donetta Potts, MD [place] Pager [phone] 03/04/2018, 3:18 PM

## 2018-03-04 NOTE — Procedures (Signed)
  Electroencephalogram report- LTM   Data acquisition: 10-20 electrode placement.  Additional T1, T2, and EKG electrodes; 26 channel digital referential acquisition reformatted to 18 channel/7 channel coronal bipolar  Spike detection: ON  Seizure detection: ON   Beginning time: 03/03/18 at 07 45 am  Ending time: 03/04/18 at 07 30 am  CPT: 95951 Day of study: day 2   This  intensive EEG monitoring with simultaneous video monitoring was performed for this patient with spells convulsions  as a part of ongoing series to capture events of interest and determine if these are seizures.   Medications: as per EMR Patient is intubated.  Sedation status is not available.  Day 1 Background activities marked by continuous delta slowing with superimposed faster frequencies in the beta range distributed broadly with paracentral dominance.  More prominent delta slowing and lack of faster frequencies appeared to be across right hemisphere.  Several pushbutton activation events around 6 AM noted.  Activated by staff and response to subtle body movements.  Patient appeared to be aroused during that time stimulated due to some type of nursing procedures or vital signs check in.  At that time background activities changes into more prominent delta slowing distributed broadly.  There was no ictal pattern to suggest seizure.  Intermittently there is a muscle twitch artifact particularly present right temporal (T4) and sometimes to lesser extent left temporal region.  That was not a seizure.  Patient however did have subclinical electrographic seizure at 1649.  There was no clinical accompaniment.  Electrographically seizure manifested as a buildup of right occipital low amplitude polyspike and wave discharges involving in morphology frequency amplitude however remain confined to the right occipital region for quite a while until there is a recruitment of adjacent right posterior cortex right parietal and right parietal  paracentral region with negative field extending to the midline and the left parieto-occipital cortex until cessation of ictal activities.  Day 2: No clinical or subclinical seizures present throughout the recording.  Background activities marked by delta slowing with superimposed faster frequencies and alpha and beta range distributed broadly with pericentral dominance.  Clinical interpretation: This day 2 of intensive EEG monitoring with simultaneous monitoring did not record any clinical subclinical seizures.  However EEG is abnormal due to background activity slowing suggestive of encephalopathy of nonspecific etiologies.  Clinical correlation is advised.

## 2018-03-04 NOTE — Social Work (Signed)
CSW was advised by [name] that family interested in hospice referral to [facility].  CSW then called Sharee Pimple, daughter on file and discussed referral to hospice. Daughter prefers [facility].  CSW contacted Audrea Muscat, who advised that she already received referral from CSW-Isabel and that she intends to meet with family today for a possible admission tomorrow.  CSW will continue to follow.  Elissa Hefty, LCSW Clinical Social Worker [phone]

## 2018-03-04 NOTE — Progress Notes (Signed)
[facility] Liaison RN note.  Received request from Westley Hummer, [place] for family interest in [place] with request for transfer 03/05/2018. Chart reviewed and patient approved to transfer.  Met with family to confirm interest and explain services. Family agreeable to transfer 03/05/2018. Michiel Cowboy, CSW aware.  Registration paper work completed.. Dr. Orpah Melter to assume care per family request. Please fax discharge summary to [phone]. RN please call report to [phone].  Please arrange transport for patient to arrive before noon if possible.  Thank you,  Farrel Gordon, RN, [facility] Liaison  [place] are on AMION

## 2018-03-04 NOTE — Progress Notes (Signed)
Re-rounded to check on patient's comfort. Patient was somewhat agitated.  Father requested increase in dose of ativan.  Further, family conversed about [facility] and would like for [place] to go to [facility] if possible.  Discussed with social work.  Florentina Jenny, PA-C Palliative Medicine Pager: [phone]

## 2018-03-04 NOTE — Progress Notes (Signed)
LTM EEG discontinued.  

## 2018-03-04 NOTE — Care Management Note (Signed)
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 She presented with seizures and required intubation and mechanical ventilation.  PTA, pt required assistance with ADLS; lives with family.                       Action/Plan: Palliative Team following to assist with goals of care.  Family prefers residential hospice facility; consulted CSW to facilitate dc to [place] upon bed availability.    Expected Discharge Date:                  Expected Discharge Plan:  [place]  In-House Referral:  Clinical Social Work  Discharge planning Services  CM Consult  Post Acute Care Choice:    Choice offered to:     DME Arranged:    DME Agency:     HH Arranged:    [place] Agency:     Status of Service:  In process, will continue to follow  If discussed at Long Length of Stay Meetings, dates discussed:    Additional Comments:  Reinaldo Raddle, RN, BSN  Trauma/Neuro ICU Case Manager [phone]

## 2018-03-05 DIAGNOSIS — N186 End stage renal disease: Secondary | ICD-10-CM

## 2018-03-05 MED ORDER — HYDROMORPHONE BOLUS VIA INFUSION
1.0000 mg | INTRAVENOUS | Status: DC | PRN
  Administered 2018-03-06: 1 mg via INTRAVENOUS
  Filled 2018-03-05: qty 1

## 2018-03-05 MED ORDER — SODIUM CHLORIDE 0.9 % IV SOLN
0.5000 mg/h | INTRAVENOUS | Status: DC
  Administered 2018-03-05 – 2018-03-07 (×3): 0.5 mg/h via INTRAVENOUS
  Filled 2018-03-05 (×3): qty 2.5

## 2018-03-05 MED ORDER — GLYCOPYRROLATE 0.2 MG/ML IJ SOLN
0.4000 mg | Freq: Four times a day (QID) | INTRAMUSCULAR | Status: DC
  Administered 2018-03-05 – 2018-03-07 (×12): 0.4 mg via INTRAVENOUS
  Filled 2018-03-05 (×13): qty 2

## 2018-03-05 NOTE — Progress Notes (Signed)
Patient seen by Palliative care PA will cancel transfer to [facility] and will continue comfort care at the hospital. Family at bedside

## 2018-03-05 NOTE — Social Work (Addendum)
Bed available at [facility] today, pt able to transfer.  Await discharge summary in order to arrange PTAR to [facility].   1:38pm- Aware pt is declining medically, [facility] transfer cancelled this morning, will continue to follow as needed for support and transfer if again becomes appropriate.    Alexander Mt, [place] Work [phone]

## 2018-03-05 NOTE — Discharge Summary (Signed)
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  She underwent a cardiac transplant approximately 20 years ago.  She has developed renal failure and is normally on peritoneal dialysis at home.  She is recently developed mental status changes and been diagnosed with a CNS lymphoma.  She was in route to visit the neuro oncologist at [facility] when she suffered from a generalized seizure and was brought to the emergency room at [facility].  She required intubation and mechanical ventilation after administration of benzodiazepines to control her seizures.  Noncontrast CT scan of the head did not show any acute event but did suggest multiple white matter lesions.  Her seizures were controlled with the addition of valproic acid to her usual Keppra, and she was subsequently extubated.      Extensive discussions were undertaken as to the appropriate level of care.  I spoke with Dr. Lars Pinks, her neuro oncologist in order to have a better feeling for what options could be presented to the patient and her family.  Unfortunately the patient's mental status waxed and waned and she was only partially oriented and clearly not capable of directing her own care.  After consideration of the options family felt that the patient would not desire any further aggressive care.  Peritoneal dialysis was discontinued on 7/3 and arrangements were made to transfer the patient to inpatient hospice.  On the morning of transfer, the  patient is not verbally interactive. Pulse is 103 blood pressure 128/91 temperature is 98.2 degrees. Respirations are unlabored there is some  slight gurgling  Occasions at the time of discharge include Decadron 4 mg IV every 8 hours, Keppra 500 mg IV every 12 hours, PRN Ativan, and Depakote 500 mg IV every 8 hours.  I have discontinued her Haldol due to its potential to lower seizure threshold.    Family is present at the bedside at the time of my visit and aware of current plans.

## 2018-03-05 NOTE — Progress Notes (Signed)
Discharged to [facility] place via EMS. Report given to [employer]. O2 via nasal cannula at 4L/min maintained. Foley catheter in situ. Will continue comfort care

## 2018-03-05 NOTE — Progress Notes (Signed)
Daily Progress Note   Patient Name: Cheyenne Sanchez       Date: 03/05/2018 DOB: 09-29-1965  Age: 52 y.o. MRN#: [id number] Attending Physician: Sampson Goon, MD Primary Care Physician: Raina Mina., MD Admit Date: 03/04/2018  Reason for Consultation/Follow-up: Establishing goals of care, Non pain symptom management, Pain control and Psychosocial/spiritual support  Subjective: Patient is non-responsive today.  Family at bedside stated she changed at approximately 8 pm last night.  She became non-responsive and her breathing changed.  They are concerned about her comfort.  I discussed the patient with the attending physician.  We agreed the patient is likely starting the process of actively dying.  It is not in her best interest to transfer her outside the hospital.  She needs IV infusions of both pain and anti-seizure medications.  Family agreeable to keeping her her.  They are grateful for the excellent care she has received.   Assessment: Patient now non-responsive, breathing labored, breath sounds are wet   Patient Profile/HPI:  52 y.o. 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       Length of Stay: 3  Current Medications: Scheduled Meds:  . dexamethasone  4 mg Intravenous TID  . glycopyrrolate  0.4 mg Intravenous QID  . LORazepam  1 mg Intravenous Q8H    Continuous Infusions: . sodium chloride    . sodium chloride 10 mL/hr at 03/05/18 0000  . HYDROmorphone    .  levETIRAcetam Stopped (03/04/18 2358)  . valproate sodium 500 mg (03/05/18 0532)    PRN Meds: sodium chloride, [DISCONTINUED] acetaminophen **OR** acetaminophen, antiseptic oral rinse, glycopyrrolate **OR** glycopyrrolate **OR** glycopyrrolate, HYDROmorphone, HYDROmorphone (DILAUDID) injection, LORazepam **OR** [DISCONTINUED] LORazepam **OR** LORazepam, ondansetron **OR** ondansetron (ZOFRAN) IV, polyvinyl alcohol  Physical Exam        Well developed chronically ill appearing female, gray coloring.  Now nonresponsive. CV tachy and irregular with systolic murmur resp wet breath sounds, increased work of breathing Abdomen soft, nt, nd Left upper extremity very swollen.   Vital Signs: BP (!) 128/91 (BP  Location: Left Arm)   Pulse (!) 103   Temp 98.2 F (36.8 C) (Oral)   Resp 16   Ht 5' (1.524 m)   Wt 60.4 kg (133 lb 2.5 oz)   SpO2 92%   BMI 26.01 kg/m  SpO2: SpO2: 92 % O2 Device: O2 Device: Nasal Cannula O2 Flow Rate: O2 Flow Rate (L/min): 4 L/min  Intake/output summary:   Intake/Output Summary (Last 24 hours) at 03/05/2018 1101 Last data filed at 03/05/2018 0900 Gross per 24 hour  Intake 533.16 ml  Output 166 ml  Net 367.16 ml   LBM: Last BM Date: 03/04/18 Baseline Weight: Weight: 57.2 kg (126 lb) Most recent weight: Weight: 60.4 kg (133 lb 2.5 oz)       Palliative Assessment/Data: 10%      Patient Active Problem List   Diagnosis Date Noted  . Status epilepticus ([place])   . ESRD on dialysis ([place])   . CNS lymphoma ([place])   . Advance care planning   . Palliative care by specialist   . Goals of care, counseling/discussion   . Seizure ([place]) 03/07/2018  . Hodgkin's disease [facility])     Palliative Care Plan    Recommendations/Plan:  Full comfort care.  I believe patient is actively dying.  Cancel transfer to [facility] place  Initiate low dose continuous dilaudid infusion with PRN boluses for labored breathing or any distress.  Initiate scheduled robinul  TID  Continue scheduled and PRN ativan  Continue IV anti seizure medications  Continue IV steroids    Goals of Care and Additional Recommendations:  Limitations on Scope of Treatment: Full Comfort Care  Code Status:  DNR  Prognosis:    Hours - Days   Discharge Planning:  Anticipated Hospital Death  Care plan was discussed with family, CCM MD, social work, Therapist, sports  Thank you for allowing the Palliative Medicine Team to assist in the care of this patient.  Total time spent:  35 min     Greater than 50%  of this time was spent counseling and coordinating care related to the above assessment and plan.  Florentina Jenny, PA-C Palliative Medicine  Please contact Palliative MedicineTeam phone at [phone] for questions and concerns between 7 am - 7 pm.   Please see AMION for individual provider pager numbers.

## 2018-03-05 NOTE — Discharge Instructions (Signed)
Discharge to hospice with medicines as outlined in the discharge summary.  Family is accompanying patient and aware of plans.

## 2018-03-06 DIAGNOSIS — N186 End stage renal disease: Secondary | ICD-10-CM

## 2018-03-06 DIAGNOSIS — Z515 Encounter for palliative care: Secondary | ICD-10-CM

## 2018-03-06 DIAGNOSIS — R569 Unspecified convulsions: Secondary | ICD-10-CM

## 2018-03-06 MED ORDER — ATROPINE SULFATE 1 % OP SOLN
2.0000 [drp] | Freq: Four times a day (QID) | OPHTHALMIC | Status: DC | PRN
  Administered 2018-03-06: 2 [drp] via SUBLINGUAL
  Filled 2018-03-06: qty 2

## 2018-03-06 MED ORDER — ATROPINE ORAL SOLUTION 0.08 MG/ML: 0.2500 mg | Freq: Four times a day (QID) | ORAL | Status: DC | PRN

## 2018-03-06 NOTE — Progress Notes (Signed)
[facility] PCCM  S: transitioned to full comfort measures, transfer to [facility] canceled.  Family reports some increase in oral secretions but otherwise no acute events.   O:  Vitals:   03/04/18 1600 03/04/18 1737 03/05/18 0612 03/06/18 0640  BP: (!) 157/97 (!) 153/99 (!) 128/91 96/62  Pulse: (!) 108 (!) 109 (!) 103 (!) 102  Resp: 16 (!) 26 16 14   Temp: 99.1 F (37.3 C) 99.5 F (37.5 C) 98.2 F (36.8 C) 100 F (37.8 C)  TempSrc:  Oral Oral Oral  SpO2: 94% 93% 92% 92%  Weight:      Height:       5L [place]  General:  Resting comfortably in bed HENT: NCAT OP clear PULM: CTA B, normal effort CV: RRR, no mgr Derm: warm, dry MSK: normal bulk and tone Neuro: unresponsive  Impression: Actively dying S/p cardiac transplant Lymphoma with brain mets Seizures  Plan: Continue IV steroids Continue IV Keppra, ativan, valproate Continue IV dilaudid infusion Add atropine oral drops Continue glycopyrrolate  Family updated  Roselie Awkward, MD [place] PCCM Pager: [phone] Cell: [phone] After 3pm or if no response, call [phone]

## 2018-03-06 NOTE — Progress Notes (Signed)
Daily Progress Note   Patient Name: Cheyenne Sanchez       Date: 03/06/2018 DOB: 1966-05-06  Age: 52 y.o. MRN#: [id number] Attending Physician: Sampson Goon, MD Primary Care Physician: Raina Mina., MD Admit Date: 03/17/2018  Reason for Consultation/Follow-up: Establishing goals of care and Terminal Care  Subjective: Cheyenne Sanchez is actively dying. Nonresponsive. Family at bedside. Noted atropine added by pulmonology. Appears comfortable. Answered family questions re: dying process, signs of discomfort, what to expect as transition progresses. Gave emotional support.   Review of Systems  Unable to perform ROS: Patient unresponsive    Length of Stay: 4  Current Medications: Scheduled Meds:  . dexamethasone  4 mg Intravenous TID  . glycopyrrolate  0.4 mg Intravenous QID  . LORazepam  1 mg Intravenous Q8H    Continuous Infusions: . sodium chloride 10 mL/hr at 03/05/18 1600  . HYDROmorphone 0.5 mg/hr (03/05/18 1600)  . levETIRAcetam 500 mg (03/06/18 0017)  . valproate sodium 500 mg (03/06/18 0540)    PRN Meds: [DISCONTINUED] acetaminophen **OR** acetaminophen, antiseptic oral rinse, atropine, glycopyrrolate **OR** glycopyrrolate **OR** glycopyrrolate, HYDROmorphone, HYDROmorphone (DILAUDID) injection, LORazepam **OR** [DISCONTINUED] LORazepam **OR** LORazepam, ondansetron **OR** ondansetron (ZOFRAN) IV, polyvinyl alcohol  Physical Exam  Cardiovascular:  Weak distal pulses  Pulmonary/Chest: Effort normal.  Neurological:  nonresponsive  Skin: There is pallor.  Nursing note and vitals reviewed.           Vital Signs: BP 96/62 (BP Location: Right Arm)   Pulse (!) 102   Temp 100 F (37.8 C) (Oral)   Resp 14   Ht 5' (1.524 m)   Wt 60.4 kg (133 lb 2.5 oz)   SpO2 92%   BMI 26.01  kg/m  SpO2: SpO2: 92 % O2 Device: O2 Device: Nasal Cannula O2 Flow Rate: O2 Flow Rate (L/min): 4 L/min  Intake/output summary:   Intake/Output Summary (Last 24 hours) at 03/06/2018 1010 Last data filed at 03/06/2018 5732 Gross per 24 hour  Intake 297.62 ml  Output 150 ml  Net 147.62 ml   LBM: Last BM Date: 03/04/18 Baseline Weight: Weight: 57.2 kg (126 lb) Most recent weight: Weight: 60.4 kg (133 lb 2.5 oz)       Palliative Assessment/Data: PPS: 10%      Patient Active Problem List   Diagnosis Date Noted  .  Status epilepticus ([place])   . ESRD on dialysis ([place])   . CNS lymphoma ([place])   . Advance care planning   . Palliative care by specialist   . Goals of care, counseling/discussion   . Seizure ([place]) 03/23/2018  . Hodgkin's disease [facility])     Palliative Care Assessment & Plan   Patient Profile:  52 y.o. 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 She has been transitioned to comfort care. Now actively dying.  Assessment/Recommendations/Plan   Continue comfort care as ordered.  Patient is not stable for transfer  Decrease O2 to 2L  Goals of Care and Additional Recommendations:  Limitations on Scope of Treatment: Full Comfort Care  Code Status:  DNR  Prognosis:   Hours - Days  Discharge Planning:  Anticipated Hospital Death  Care plan was discussed with patient's family.  Thank you for allowing the Palliative Medicine Team to assist in the care of this patient.   Time In: 0945 Time Out: 1020 Total Time 35 mins Prolonged Time Billed No      Greater than 50%  of this time was spent counseling and coordinating care related to the above assessment  and plan.  Cheyenne Sanchez, AGNP-C Palliative Medicine   Please contact Palliative Medicine Team phone at [phone] for questions and concerns.

## 2018-03-06 NOTE — Progress Notes (Signed)
Nutrition Brief Note  Chart reviewed. Pt now transitioning to comfort care.  No further nutrition interventions warranted at this time.  Please re-consult as needed.   Kayson Bullis A. Adelfo Diebel, RD, LDN, CDE Pager: [phone] After hours Pager: [phone]  

## 2018-03-07 DIAGNOSIS — R52 Pain, unspecified: Secondary | ICD-10-CM

## 2018-03-07 MED ORDER — HYDROMORPHONE BOLUS VIA INFUSION
1.0000 mg | INTRAVENOUS | Status: DC | PRN
  Filled 2018-03-07: qty 1

## 2018-03-07 NOTE — Progress Notes (Signed)
Daily Progress Note   Patient Name: Cheyenne Sanchez       Date: 03/07/2018 DOB: 1966-04-24  Age: 52 y.o. MRN#: [id number] Attending Physician: [name], MD Primary Care Physician: Raina Mina., MD Admit Date: 03/30/2018  Reason for Consultation/Follow-up: Non pain symptom management, Pain control, Psychosocial/spiritual support and Terminal Care  Subjective: Patient seen, chart reviewed.  Patient's mother, father, sister and brother-in-law are all at the bedside.  Patient appears to be actively dying.  Family is aware  Chart reviewed specifically MAR.  Very few boluses noted for Dilaudid continuous infusion.  Length of Stay: 5  Current Medications: Scheduled Meds:  . dexamethasone  4 mg Intravenous TID  . glycopyrrolate  0.4 mg Intravenous QID  . LORazepam  1 mg Intravenous Q8H    Continuous Infusions: . sodium chloride 10 mL/hr at 03/05/18 1600  . HYDROmorphone 0.5 mg/hr (03/07/18 1327)  . levETIRAcetam 500 mg (03/07/18 1141)  . valproate sodium 500 mg (03/07/18 1419)    PRN Meds: [DISCONTINUED] acetaminophen **OR** acetaminophen, antiseptic oral rinse, atropine, glycopyrrolate **OR** glycopyrrolate **OR** glycopyrrolate, HYDROmorphone, LORazepam **OR** [DISCONTINUED] LORazepam **OR** LORazepam, ondansetron **OR** ondansetron (ZOFRAN) IV, polyvinyl alcohol  Physical Exam  Constitutional:  Acutely ill appearing older female; she appears to be actively dying Responsive to voice and light touch Anasarca  HENT:  Head: Normocephalic and atraumatic.  Pulmonary/Chest:  Respiratory effort very weak with frequent 10 to 15-second apnea.  RR 12 Tachypnea  Abdominal: Soft.  Neurological:  Unresponsive  Skin:  Cool, mottled  Psychiatric:  Unresponsive to voice and light  touch; no overt agitation, otherwise unable to  test  Nursing note and vitals reviewed.           Vital Signs: BP (!) 74/43 (BP Location: Left Arm)   Pulse 86   Temp 98.7 F (37.1 C) (Axillary)   Resp 14   Ht 5' (1.524 m)   Wt 60.4 kg (133 lb 2.5 oz)   SpO2 (!) 89%   BMI 26.01 kg/m  SpO2: SpO2: (!) 89 % O2 Device: O2 Device: Nasal Cannula O2 Flow Rate: O2 Flow Rate (L/min): 4 L/min  Intake/output summary:   Intake/Output Summary (Last 24 hours) at 03/07/2018 1558 Last data filed at 03/07/2018 0500 Gross per 24 hour  Intake -  Output 0 ml  Net 0  ml   LBM: Last BM Date: 03/04/18 Baseline Weight: Weight: 57.2 kg (126 lb) Most recent weight: Weight: 60.4 kg (133 lb 2.5 oz)       Palliative Assessment/Data:    Flowsheet Rows     Most Recent Value  Intake Tab  Referral Department  Hospitalist  Unit at Time of Referral  Other (Comment) [Neuro]  Palliative Care Primary Diagnosis  Cancer  Date Notified  03/04/2018  Palliative Care Type  New Palliative care  Reason for referral  Clarify Goals of Care  Date of Admission  03/10/2018  Date first seen by Palliative Care  03/03/18  # of days Palliative referral response time  1 Day(s)  # of days IP prior to Palliative referral  0  Clinical Assessment  Psychosocial & Spiritual Assessment  Palliative Care Outcomes      Patient Active Problem List   Diagnosis Date Noted  . Terminal care   . ESRD (end stage renal disease) ([place])   . Status epilepticus ([place])   . ESRD on dialysis ([place])   . CNS lymphoma ([place])   . Advance care planning   . Palliative care by specialist   . Goals of care, counseling/discussion   . Seizure ([place]) 03/22/2018  . 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  She has been transitioned to comfort care. Now actively dying and on a Dilaudid continuous infusion, antiepileptics still on board   Recommendations/Plan:  Pain: Appears to be well managed on current Dilaudid drip at 0.5 mg an hour.  Continue with titration range of 0.5 to 2 mg an hour.  Will adjust bolus via continuous infusion to 1 mg every 30 minutes as needed for breakthrough pain or shortness of breath  Agitation: Improving.  Continue scheduled Ativan  Secretions: Improving.  Continue scheduled Robinul as well as atropine as needed  Seizures: No recent seizure activity noted by family or charted.  Continue with Depacon Keppra and Decadron  Patient looks like she is within hours to a day of end of life.  She does not appear stable for transport.  Family wishes to remain in the hospital for end-of-life care  Goals of Care and Additional Recommendations:  Limitations on Scope of Treatment: Avoid Hospitalization, Full Comfort Care, Minimize Medications, No Artificial Feeding, No Blood Transfusions, No Chemotherapy, No Diagnostics, No Hemodialysis, No IV Antibiotics, No Lab Draws, No Radiation, No Surgical Procedures and No Tracheostomy  Code Status:    Code Status Orders  (From admission, onward)        Start     Ordered   03/04/18 1152  Do not attempt resuscitation (DNR)  Continuous    Question Answer Comment  In the event of cardiac or respiratory ARREST Do not call a "code blue"   In the event of cardiac or respiratory ARREST Do not perform Intubation, CPR, defibrillation or ACLS   In the event of cardiac or respiratory ARREST Use medication by any route, position, wound care, and other measures to relive pain and suffering. May use oxygen, suction and manual treatment of airway obstruction as needed for comfort.       03/04/18 1154  Code Status History    Date Active Date Inactive Code Status Order ID Comments User Context   03/03/2018 1701 03/04/2018 1154 DNR [id number]  Sampson Goon, MD Inpatient   03/07/2018 1232 03/03/2018 1701 Partial Code [id number]  Corey Harold, NP ED       Prognosis:   Hours - Days  Discharge Planning:  Anticipated Hospital Death   Thank you for allowing the Palliative Medicine Team to assist in the care of this patient.   Time In: 1430 Time Out: 1505 Total Time 35 min Prolonged Time Billed  no       Greater than 50%  of this time was spent counseling and coordinating care related to the above assessment and plan.  Dory Horn, NP  Please contact Palliative Medicine Team phone at [phone] for questions and concerns.

## 2018-03-07 NOTE — Progress Notes (Signed)
PCCM PROGRESS NOTE  Brief description: Cheyenne Sanchez is a 52 y.o. female admitted on 03/07/2018 with syncope and seizure.  She required intubation for airway protection.  She was seen by neurology and treated with AEDs.  Renal was consulted for peritoneal dialysis.  Palliative care consulted.  Family decided for DNR status and transition to comfort measures.  She was extubated on 03/03/18.  Subjective: Appears comfortable.  Vital signs: BP (!) 74/43 (BP Location: Left Arm)   Pulse 86   Temp 98.7 F (37.1 C) (Axillary)   Resp 14   Ht 5' (1.524 m)   Wt 133 lb 2.5 oz (60.4 kg)   SpO2 (!) 89%   BMI 26.01 kg/m   Physical exam: Comatose.  Shallow respirations.  HR regular.  Scattered rhonchi.  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

## 2018-03-07 NOTE — Progress Notes (Signed)
[facility] PCCM  S: 03/07/2018 full comfort care mode, remains on Dilaudid drip, agonal breathing.   O:  BP (!) 74/43 (BP Location: Left Arm)   Pulse 86   Temp 98.7 F (37.1 C) (Axillary)   Resp 14   Ht 5' (1.524 m)   Wt 133 lb 2.5 oz (60.4 kg)   SpO2 (!) 89%   BMI 26.01 kg/m  Recent Labs  Lab 03/31/2018 1000 03/15/2018 1013 03/03/18 0317  NA 135 132* 134*  K 4.4 4.3 4.6  CL 94* 95* 96*  CO2 22  --  24  BUN 99* 98* 99*  CREATININE 6.47* 6.40* 6.13*  GLUCOSE 143* 136* 124*   Recent Labs  Lab 03/05/2018 1000 03/29/2018 1013 03/03/18 0317  HGB 12.0 12.9 9.2*  HCT 40.3 38.0 31.0*  WBC 9.9  --  4.8  PLT 44*  --  32*    5L [place]  General: Sedated female with agonal breathing HEENT: Positive JVD Neuro: Nonresponsive CV: Sounds are regular PULM: Agonal breathing pattern GI: Soft decreased bowel sounds active  Extremities: warm/dry 1+ edema  Skin: no rashes or lesions   Impression: Actively dying S/p cardiac transplant Lymphoma with brain mets Seizures  Plan: Continue current treatment Anticipate demise in the near future  01/2018 family updated by bedside  Richardson Landry Lacheryl Niesen ACNP Maryanna Shape PCCM Pager [phone] till 1 pm If no answer page [phone] 03/07/2018, 10:06 AM

## 2018-03-07 NOTE — Progress Notes (Signed)
03/07/2018 Dictation by Gaylyn Lambert, RN.  MSN.  ACNP. At the request of Candie Echevaria and to include Carnella [place]. Regarding: Documentation of life-threatening illness to family member.  Whom it may concern' Sor[place]o is terminally ill and is currently in hospice at [facility]. Her family members that include Minette Brine and Candie Echevaria are at the bedside and unable to leave or participate in plan activities due to the terminal state of their loved one.  Please accept this note as documentation.  Richardson Landry Ajai Terhaar ACNP Maryanna Shape PCCM Pager [phone] till 1 pm If no answer page [phone] 03/07/2018, 9:45 AM

## 2018-04-02 NOTE — Progress Notes (Signed)
   2018/03/16 0500  Attending Physican Contact  Attending Physician Notified Y  Attending Physician Name Boone Master  Will the above attending physician sign death certificate?  (non ED physician) No  Post Mortem Checklist  Date of Death 03-16-2018  Time of Death 0435  Pronounced By Barbaraann Rondo, Nellie RN  Next of kin notified Yes  Name of next of kin notified of death Jannifer Hick Pelt  Contact Person's Relationship to Patient Parent  Contact Person's Phone Number [phone]  Contact Person's address [address] [place] [address]  Was the patient a No Code Blue or a Limited Code Blue? Yes  Did the patient die unattended? No  Patient restrained? Not applicable  Weight 09.9 kg (133 lb 2.5 oz)  [employer] [facility]  Notification Date 03-16-18  Notification Time [place] Donor Service Number [phone]  Is patient a potential donor? Y  Donation Type Eyes;Tissue  Eye prep completed Yes  Autopsy  Autopsy requested by N/A  Patient Belongings/Medications Returned  Patient belongings from bedside/safe/pharmacy returned  Yes  Valuables returned to? family  Medical Examiner  Is this a medical examiner's case? [place] home name/address/phone # [place] [phone]

## 2018-04-02 NOTE — Progress Notes (Signed)
Per Medical Examiner Izora Ribas patient is not a ME case.  Patient Placement Notified.

## 2018-04-02 NOTE — Progress Notes (Signed)
Waisted 45 ml of dilaudid with Izora Gala, Therapist, sports.

## 2018-04-02 NOTE — Death Summary Note (Addendum)
  Cheyenne Sanchez is a 52 y.o. female admitted on 03/07/2018 with syncope and seizure.  She required intubation for airway protection.  She was seen by neurology and treated with AEDs.  Renal was consulted for peritoneal dialysis.  Palliative care consulted.  Family decided for DNR status and transition to comfort measures.  She was extubated on 03/03/18.  She expired on 03/25/18.  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

## 2018-04-02 DEATH — deceased

## 2020-05-11 IMAGING — CT CT CERVICAL SPINE W/O CM
4 of 7 series · 12 of 33 positions shown, 13 images · non-contrast
Comparison: None.

CLINICAL DATA: Fall

EXAM:
CT HEAD WITHOUT CONTRAST
CT CERVICAL SPINE WITHOUT CONTRAST
TECHNIQUE: Multidetector CT imaging of the head and cervical spine was
performed following the standard protocol without intravenous
contrast. Multiplanar CT image reconstructions of the cervical spine
were also generated.

[Series 9: c_spine 2.0 st · axial · 0.24mm/px · z∈[-242,-136]mm · 4 of 89 slices shown, 5 images]
[im 18/89  soft-tissue]
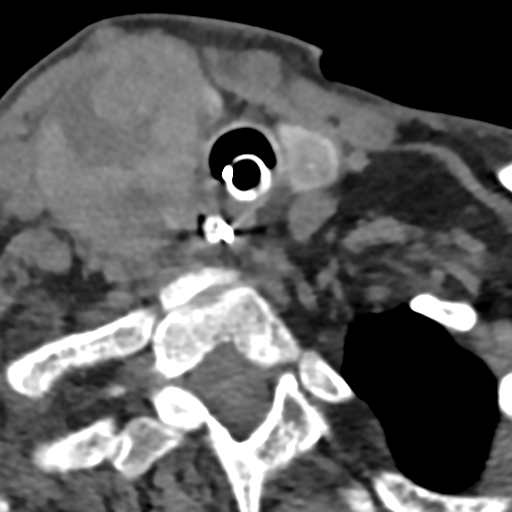
[im 18/89  bone]
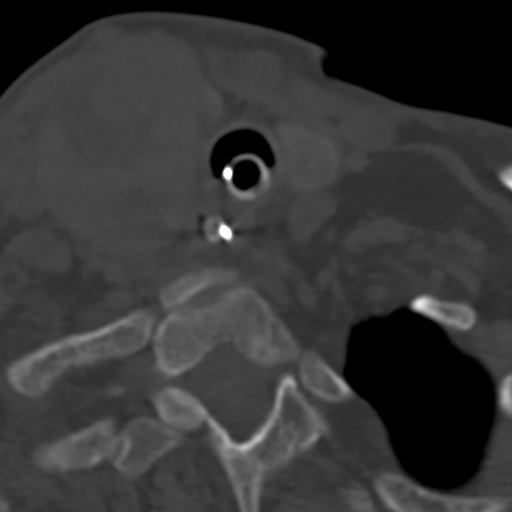
[im 36/89  bone]
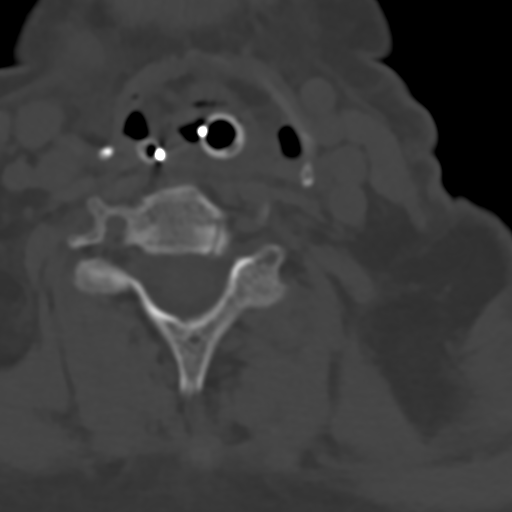
[im 53/89  bone]
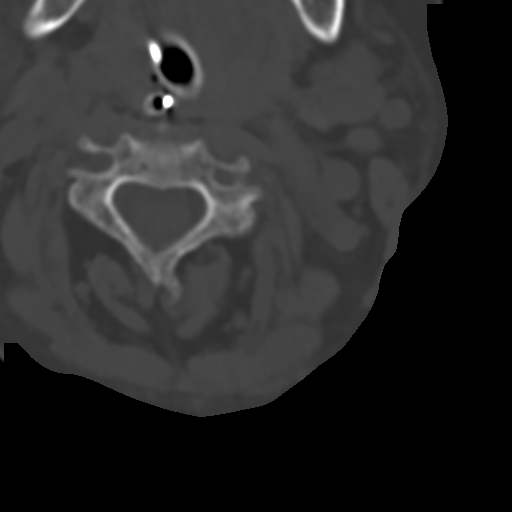
[im 71/89  bone]
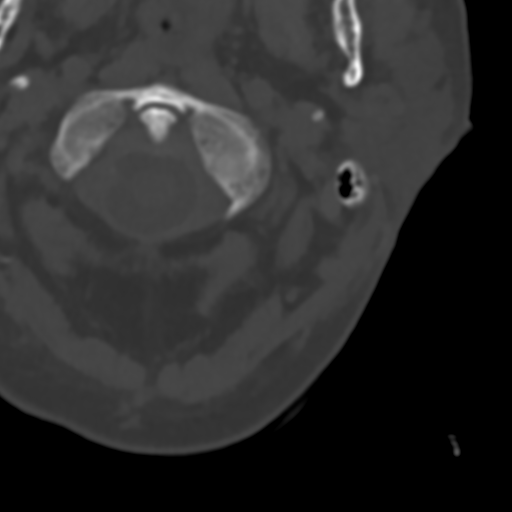

[Series 10: coronal bone · coronal · 0.20mm/px · 1 of 61 slices shown]
[im 31/61  bone]
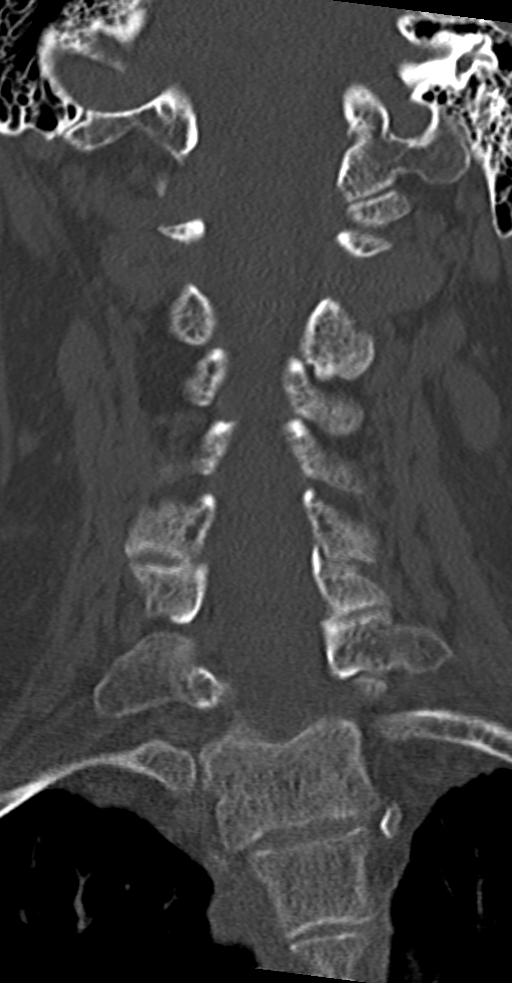

[Series 11: sagittal bone · sagittal · 0.18mm/px · 4 of 46 slices shown]
[im 10/46  bone]
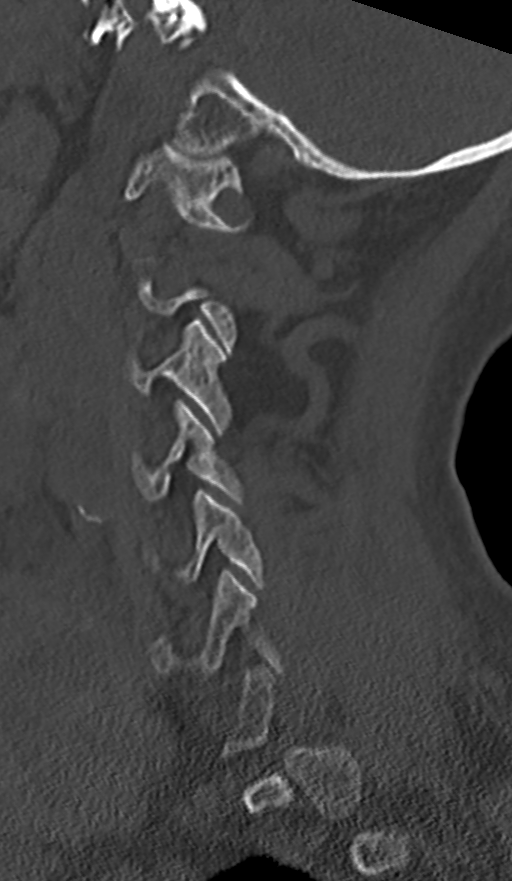
[im 19/46  bone]
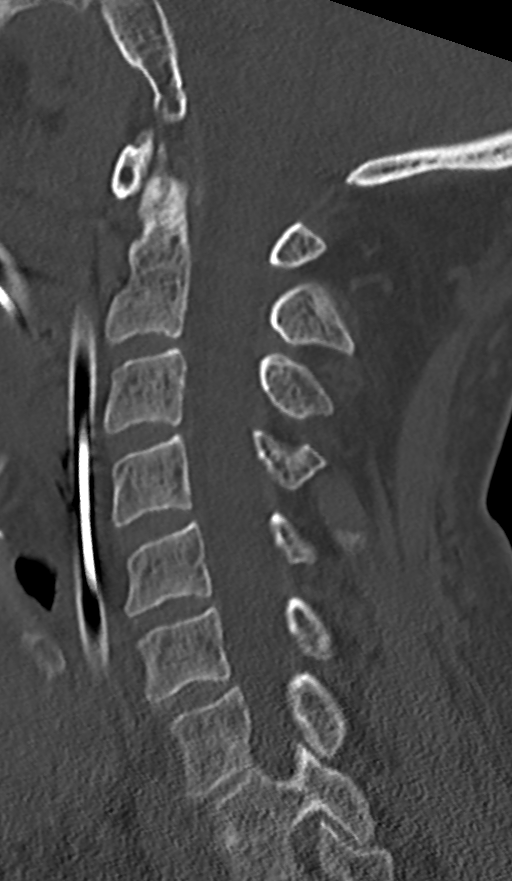
[im 28/46  bone]
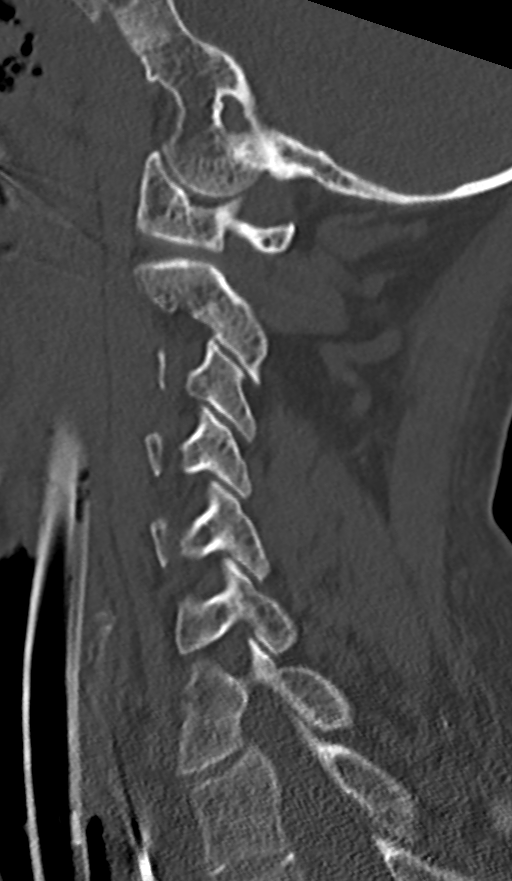
[im 37/46  bone]
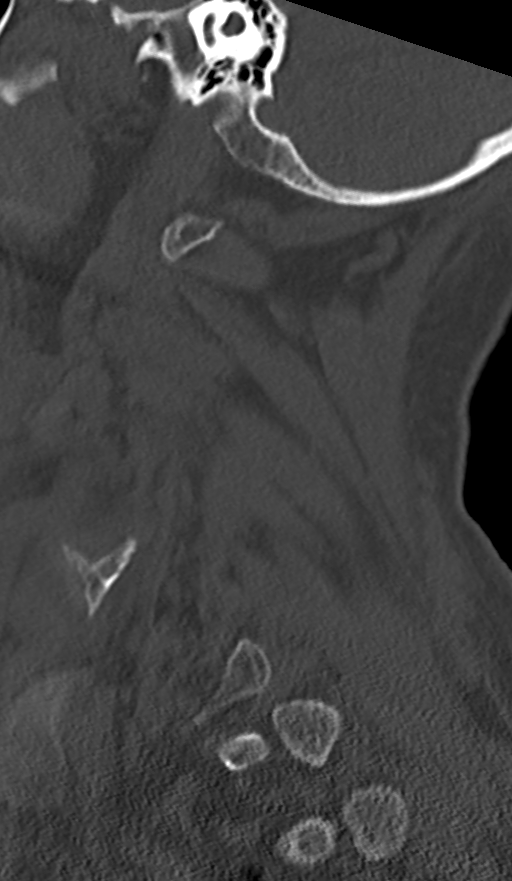

[Series 13: orthogonal axial st · axial · 0.21mm/px · z∈[-241,-169]mm · 3 of 78 slices shown]
[im 20/78  bone]
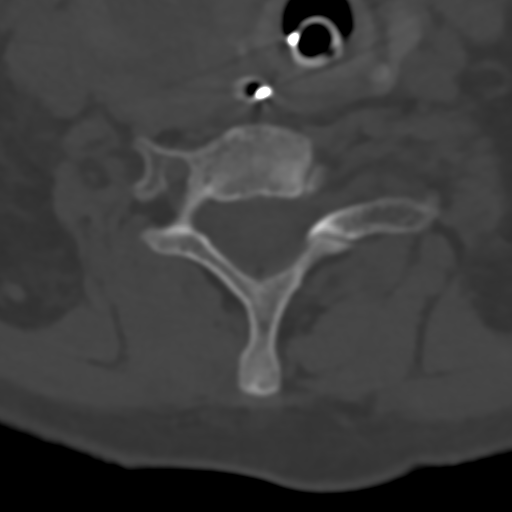
[im 39/78  bone]
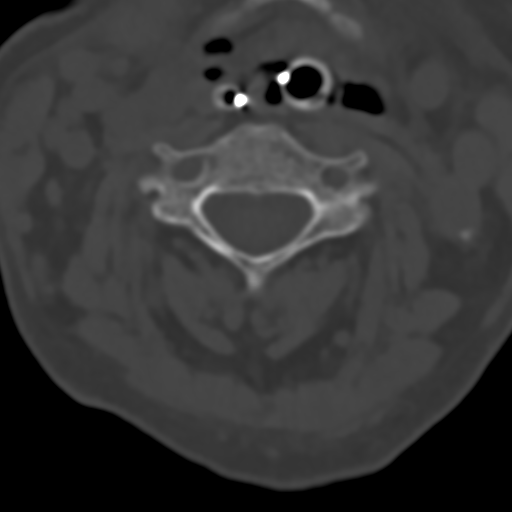
[im 58/78  bone]
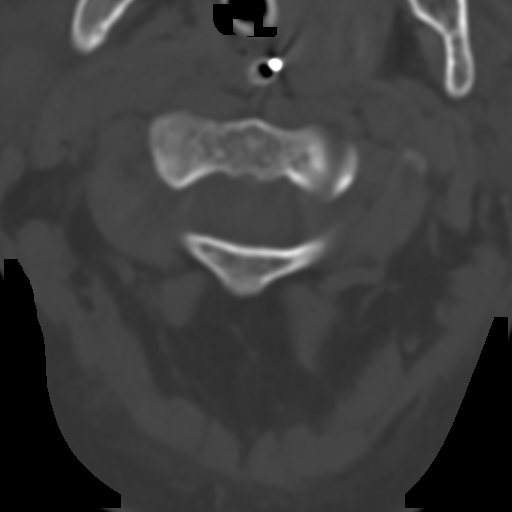

[12 of 33 positions shown; findings below may reference images not displayed]

FINDINGS: CT HEAD FINDINGS

Brain: Low-density areas throughout the deep white matter. No
hemorrhage or hydrocephalus. No acute infarction.

Vascular: No hyperdense vessel or unexpected calcification.

Skull: No acute calvarial abnormality.

Sinuses/Orbits: Visualized paranasal sinuses and mastoids clear.
Orbital soft tissues unremarkable.

Other: Soft tissue swelling in the right posterior parietal region.

CT CERVICAL SPINE FINDINGS

Alignment: Normal

Skull base and vertebrae: No fracture

Soft tissues and spinal canal: No prevertebral fluid or swelling. No
visible canal hematoma.

Disc levels:  Maintained

Upper chest: No acute findings

Other: Large mass in the right thyroid lobe measures up to 5 cm.
Smaller nodule in the left thyroid lobe measures 1.7 cm.
Tracheostomy and NG tube are noted in place.
IMPRESSION: Patchy low-density white matter disease bilaterally. This could
reflect chronic microvascular ischemic changes or other cause of
demyelination such as vasculitis, MS.

No acute intracranial abnormality.

No acute bony abnormality in the cervical spine.

Bilateral thyroid nodules and masses, right greater than left. This
likely reflects multinodular goiter. This could be further
characterized with elective thyroid ultrasound.

## 2020-05-12 IMAGING — DX DG CHEST 1V PORT
1 series · 1 of 1 positions shown · non-contrast
Comparison: 03/02/2018

CLINICAL DATA: Ventilator, seizures

EXAM:
PORTABLE CHEST 1 VIEW

[chest ap]
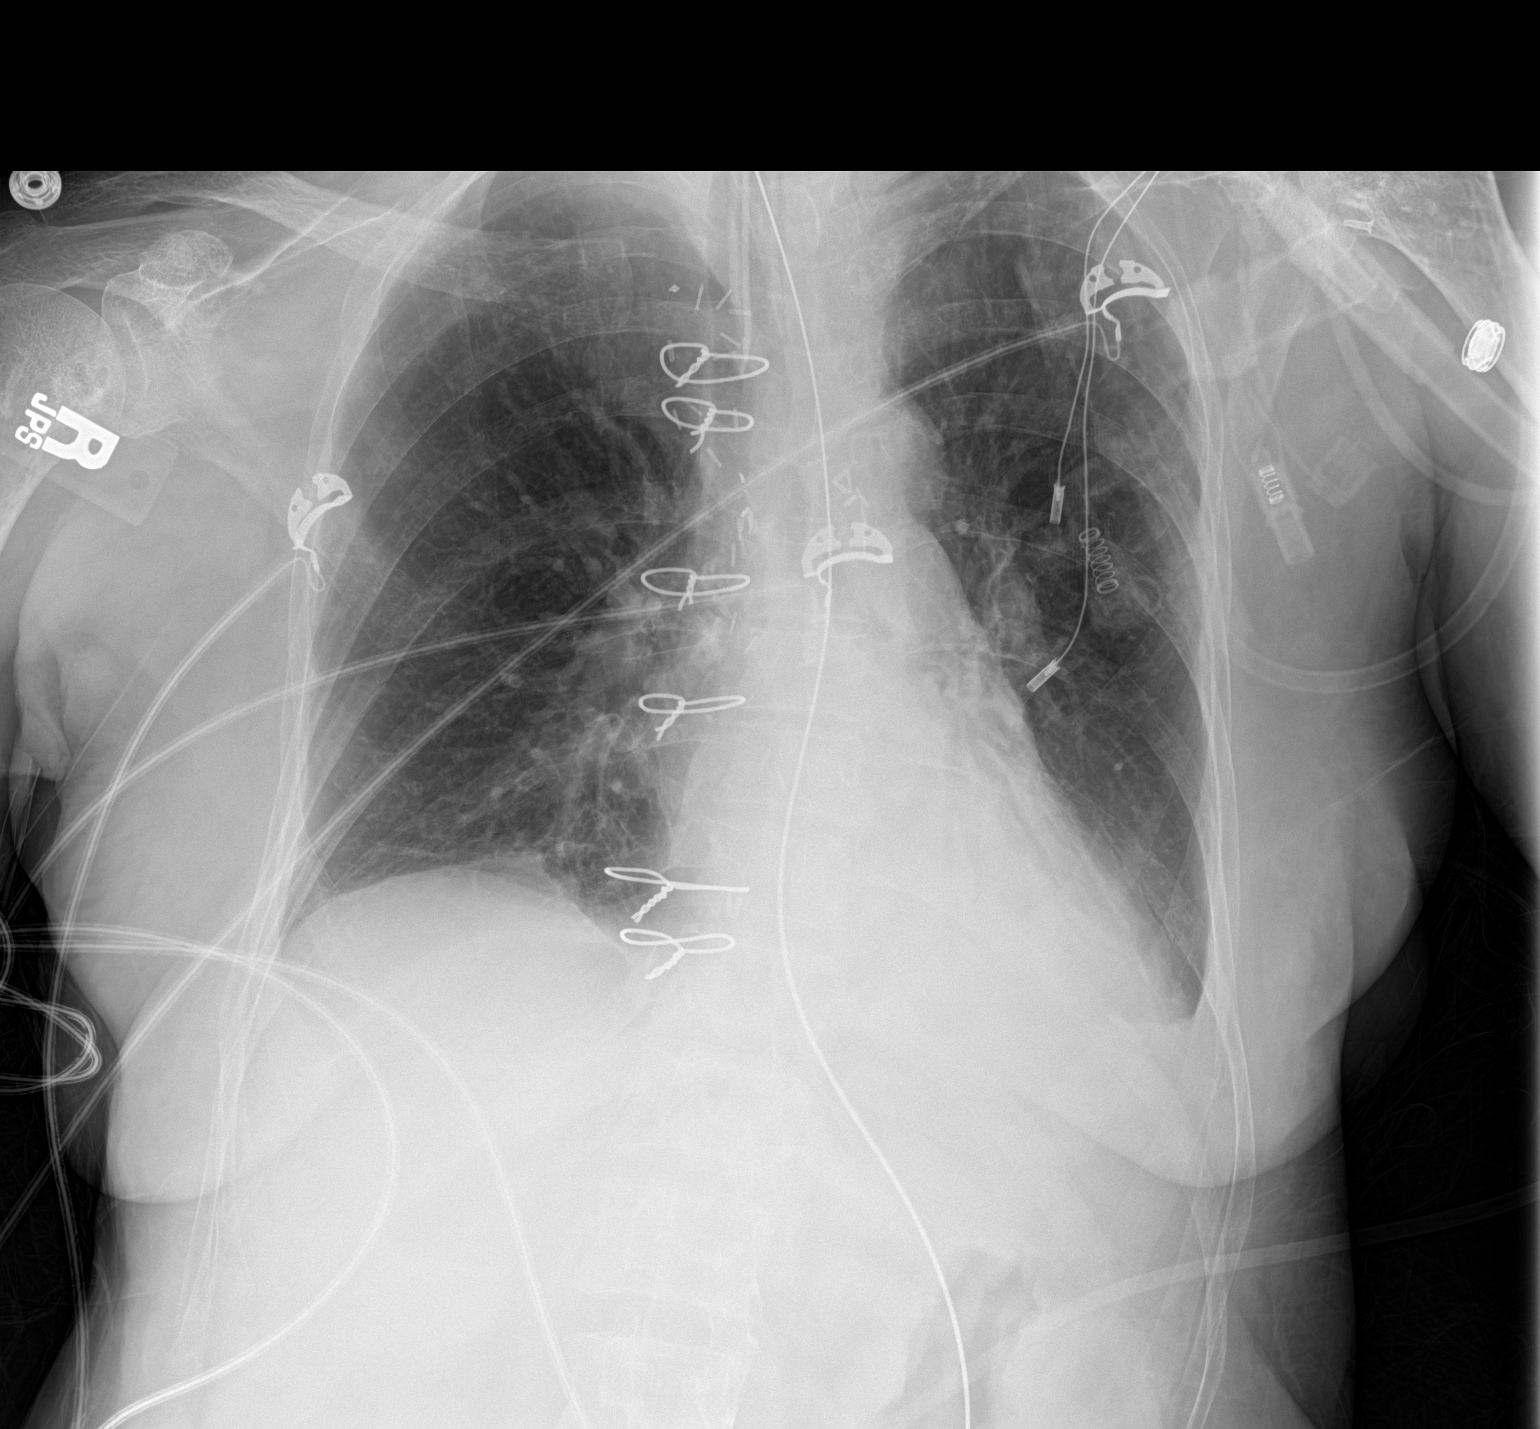

[1 of 1 positions shown; findings below may reference images not displayed]

FINDINGS: Prior median sternotomy and CABG. Heart is normal size. Small left
pleural effusion with left lower lobe atelectasis or infiltrate,
similar to prior study. Improving aeration in the right base with
decreasing atelectasis. No pneumothorax.
IMPRESSION: Continued left lower lobe atelectasis or consolidation with small
left effusion. Improving aeration in the right base.
